# Patient Record
Sex: Female | Born: 1950 | Race: White | Hispanic: No | Marital: Married | Smoking: Never smoker
Health system: Southern US, Community
[De-identification: ages and names within clinical notes are randomized; demographics above are authoritative.]

## PROBLEM LIST (undated history)

## (undated) DIAGNOSIS — F32A Depression, unspecified: Secondary | ICD-10-CM

## (undated) DIAGNOSIS — F329 Major depressive disorder, single episode, unspecified: Secondary | ICD-10-CM

## (undated) DIAGNOSIS — E785 Hyperlipidemia, unspecified: Secondary | ICD-10-CM

## (undated) DIAGNOSIS — Z9889 Other specified postprocedural states: Secondary | ICD-10-CM

## (undated) DIAGNOSIS — K219 Gastro-esophageal reflux disease without esophagitis: Secondary | ICD-10-CM

## (undated) DIAGNOSIS — I639 Cerebral infarction, unspecified: Secondary | ICD-10-CM

## (undated) DIAGNOSIS — R112 Nausea with vomiting, unspecified: Secondary | ICD-10-CM

## (undated) DIAGNOSIS — N189 Chronic kidney disease, unspecified: Secondary | ICD-10-CM

## (undated) DIAGNOSIS — I10 Essential (primary) hypertension: Secondary | ICD-10-CM

## (undated) HISTORY — PX: OTHER SURGICAL HISTORY: SHX169

## (undated) HISTORY — PX: TENNIS ELBOW RELEASE/NIRSCHEL PROCEDURE: SHX6651

## (undated) HISTORY — PX: TONSILLECTOMY: SUR1361

## (undated) HISTORY — PX: COLONOSCOPY W/ POLYPECTOMY: SHX1380

## (undated) HISTORY — PX: BREAST REDUCTION SURGERY: SHX8

## (undated) HISTORY — PX: OOPHORECTOMY: SHX86

## (undated) HISTORY — DX: Cerebral infarction, unspecified: I63.9

---

## 1978-09-01 HISTORY — PX: TUBAL LIGATION: SHX77

## 1999-05-30 ENCOUNTER — Ambulatory Visit (HOSPITAL_COMMUNITY): Admission: RE | Admit: 1999-05-30 | Discharge: 1999-05-30 | Payer: Self-pay | Admitting: Neurosurgery

## 1999-05-30 ENCOUNTER — Encounter: Payer: Self-pay | Admitting: Neurosurgery

## 1999-09-12 ENCOUNTER — Other Ambulatory Visit: Admission: RE | Admit: 1999-09-12 | Discharge: 1999-09-12 | Payer: Self-pay | Admitting: Obstetrics and Gynecology

## 1999-10-25 ENCOUNTER — Other Ambulatory Visit: Admission: RE | Admit: 1999-10-25 | Discharge: 1999-10-25 | Payer: Self-pay | Admitting: Obstetrics and Gynecology

## 2000-04-14 ENCOUNTER — Ambulatory Visit (HOSPITAL_BASED_OUTPATIENT_CLINIC_OR_DEPARTMENT_OTHER): Admission: RE | Admit: 2000-04-14 | Discharge: 2000-04-14 | Payer: Self-pay | Admitting: Plastic Surgery

## 2000-04-21 ENCOUNTER — Emergency Department (HOSPITAL_COMMUNITY): Admission: EM | Admit: 2000-04-21 | Discharge: 2000-04-21 | Payer: Self-pay | Admitting: Emergency Medicine

## 2000-04-21 ENCOUNTER — Encounter: Payer: Self-pay | Admitting: Emergency Medicine

## 2000-08-17 ENCOUNTER — Ambulatory Visit (HOSPITAL_BASED_OUTPATIENT_CLINIC_OR_DEPARTMENT_OTHER): Admission: RE | Admit: 2000-08-17 | Discharge: 2000-08-17 | Payer: Self-pay | Admitting: Otolaryngology

## 2000-10-01 ENCOUNTER — Encounter (INDEPENDENT_AMBULATORY_CARE_PROVIDER_SITE_OTHER): Payer: Self-pay | Admitting: Specialist

## 2000-10-01 ENCOUNTER — Other Ambulatory Visit: Admission: RE | Admit: 2000-10-01 | Discharge: 2000-10-01 | Payer: Self-pay | Admitting: *Deleted

## 2000-11-24 ENCOUNTER — Other Ambulatory Visit: Admission: RE | Admit: 2000-11-24 | Discharge: 2000-11-24 | Payer: Self-pay | Admitting: Obstetrics and Gynecology

## 2001-10-25 ENCOUNTER — Ambulatory Visit (HOSPITAL_COMMUNITY): Admission: RE | Admit: 2001-10-25 | Discharge: 2001-10-25 | Payer: Self-pay | Admitting: *Deleted

## 2001-10-25 ENCOUNTER — Encounter (INDEPENDENT_AMBULATORY_CARE_PROVIDER_SITE_OTHER): Payer: Self-pay | Admitting: *Deleted

## 2001-12-29 ENCOUNTER — Other Ambulatory Visit: Admission: RE | Admit: 2001-12-29 | Discharge: 2001-12-29 | Payer: Self-pay | Admitting: Obstetrics and Gynecology

## 2002-01-17 ENCOUNTER — Encounter: Payer: Self-pay | Admitting: *Deleted

## 2002-01-17 ENCOUNTER — Encounter: Admission: RE | Admit: 2002-01-17 | Discharge: 2002-01-17 | Payer: Self-pay | Admitting: *Deleted

## 2002-06-17 ENCOUNTER — Encounter: Payer: Self-pay | Admitting: Gastroenterology

## 2002-06-17 ENCOUNTER — Encounter: Admission: RE | Admit: 2002-06-17 | Discharge: 2002-06-17 | Payer: Self-pay | Admitting: Gastroenterology

## 2002-07-07 ENCOUNTER — Ambulatory Visit (HOSPITAL_COMMUNITY): Admission: RE | Admit: 2002-07-07 | Discharge: 2002-07-07 | Payer: Self-pay | Admitting: Gastroenterology

## 2002-07-07 ENCOUNTER — Encounter: Payer: Self-pay | Admitting: Gastroenterology

## 2003-01-04 ENCOUNTER — Other Ambulatory Visit: Admission: RE | Admit: 2003-01-04 | Discharge: 2003-01-04 | Payer: Self-pay | Admitting: Obstetrics and Gynecology

## 2003-11-03 ENCOUNTER — Encounter: Admission: RE | Admit: 2003-11-03 | Discharge: 2003-11-03 | Payer: Self-pay | Admitting: Family Medicine

## 2003-12-20 ENCOUNTER — Encounter (INDEPENDENT_AMBULATORY_CARE_PROVIDER_SITE_OTHER): Payer: Self-pay | Admitting: Specialist

## 2003-12-21 ENCOUNTER — Inpatient Hospital Stay (HOSPITAL_COMMUNITY): Admission: RE | Admit: 2003-12-21 | Discharge: 2003-12-23 | Payer: Self-pay | Admitting: Obstetrics and Gynecology

## 2004-08-22 ENCOUNTER — Other Ambulatory Visit: Admission: RE | Admit: 2004-08-22 | Discharge: 2004-08-22 | Payer: Self-pay | Admitting: Obstetrics and Gynecology

## 2005-10-16 ENCOUNTER — Other Ambulatory Visit: Admission: RE | Admit: 2005-10-16 | Discharge: 2005-10-16 | Payer: Self-pay | Admitting: Obstetrics and Gynecology

## 2006-08-02 ENCOUNTER — Emergency Department (HOSPITAL_COMMUNITY): Admission: EM | Admit: 2006-08-02 | Discharge: 2006-08-02 | Payer: Self-pay | Admitting: Emergency Medicine

## 2010-03-28 ENCOUNTER — Emergency Department (HOSPITAL_COMMUNITY): Admission: EM | Admit: 2010-03-28 | Discharge: 2010-03-29 | Payer: Self-pay | Admitting: Emergency Medicine

## 2010-12-25 ENCOUNTER — Other Ambulatory Visit: Payer: Self-pay | Admitting: Obstetrics and Gynecology

## 2011-01-17 NOTE — Discharge Summary (Signed)
NAME:  Kendra Deleon, Kendra Deleon                          ACCOUNT NO.:  [id number]   MEDICAL RECORD NO.:  [id number]                   PATIENT TYPE:  INP   LOCATION:  9306                                 FACILITY:  [facility]   PHYSICIAN:  Miguel Aschoff, M.D.                    DATE OF BIRTH:  Jan 27, 1951   DATE OF ADMISSION:  12/20/2003  DATE OF DISCHARGE:  12/23/2003                                 DISCHARGE SUMMARY   ADMISSION DIAGNOSES:  Persistent right adnexal mass in a postmenopausal  woman.   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   OPERATION:  Laparoscopic right salpingo-oophorectomy.   BRIEF HISTORY:  The patient is a 60 year old white female who presented with  a history of a right lower quadrant right flank pain.  The patient underwent  evaluation, which included CT scan revealing a mass in the area of the right  ovary.  This had essentially simple appearance and was followed to see if it  would resolve spontaneously.  A CA125 level was obtained and was within  normal limits.  However, after following this for several weeks, the mass  persisted as did the pain.  The patient presented to the hospital at this  point to undergo laparoscopy to see the etiology for this mass and be  corrected, and to rule out the presence of any kind of neoplasm.   HOSPITAL COURSE:  Preoperative studies were obtained.  This included  admission hemoglobin of 12.6, hematocrit of 38.5.  Chemistry profile was  within normal limits, except for slight elevation of the glucose at 137.  Urinalysis was negative.   On December 20, 2003 under general anesthesia, laparoscopy was carried out.  This revealed the presence of a normal sized uterus, left tube and ovary  were within normal limits with the ovary being in suspension with menopause,  being small in size.  On the right adnexa, however, there was a 4-5 cm  cystic mass present.  The appendix was visualized and was noted to be within  normal limits.   Intestinal structures were unremarkable, except for what  appeared to be some adhesions in the area of the ileum.  There was also some  mild inflammation noted on the descending colon just adjacent to the cecum.   At time of laparoscopy, a right salpingo-oophorectomy was carried out  without difficulty.  The patient tolerated the surgical procedure well.  The  estimated blood loss at the time of surgery was approximately 50 cc.   The patient was admitted and by the second postoperative day was tolerating  a regular diet.  Pain was managed with p.o. pain medications.  She was  discharged to home on December 23, 2003.   The pathology report of the ovary revealed the presence of a benign ovarian  cyst adenoma.  No other abnormalities were noted.   PLAN:  The  patient will be discharged home.  She is to return to the office  in four weeks for a followup examination.  She was sent home on a regular  diet in satisfactory condition.  She was instructed to have no heavy  lifting.  She is to return to work as soon as she was comfortable.   DISCHARGE MEDICATIONS:  Vicodin one q.3h. p.r.n. pain.                                               Miguel Aschoff, M.D.    AR/MEDQ  D:  12/31/2003  T:  12/31/2003  Job:  [id number]

## 2011-01-17 NOTE — Op Note (Signed)
NAME:  Kendra Deleon, Kendra Deleon                          ACCOUNT NO.:  [id number]   MEDICAL RECORD NO.:  [id number]                   PATIENT TYPE:  INP   LOCATION:  9306                                 FACILITY:  [facility]   PHYSICIAN:  Miguel Aschoff, M.D.                    DATE OF BIRTH:  06/09/1951   DATE OF PROCEDURE:  12/20/2003  DATE OF DISCHARGE:                                 OPERATIVE REPORT   PREOPERATIVE DIAGNOSES:  1. Cystic right adnexal mass in postmenopausal woman.  2. Pelvic pain.   POSTOPERATIVE DIAGNOSES:  1. Cystic right adnexal mass in postmenopausal woman.  2. Pelvic pain.   OPERATIONS AND PROCEDURES:  Laparoscopy with laparoscopic right salpingo-  oophorectomy.   SURGEON:  Dr. Miguel Aschoff.   ASSISTANT:  Dr. Conley Simmonds.   ANESTHESIA:  General.   COMPLICATIONS:  None.   JUSTIFICATION:  The patient is a 60 year old white female who presented with  a complaint of right lower quadrant pain and back pain.  On CT examination  she was noted to have a 4-5 cm cystic mass in the right adnexa.  This mass  was observed for several weeks without resolution and now the patient is  being taken to the operating room to undergo laparoscopy to see the etiology  of this mass and to removed it.  Risks and benefits of the procedure were  discussed with the patient.   DESCRIPTION OF PROCEDURE:  The patient was taken to the operating room,  placed in the supine position.  General anesthesia was administered without  difficulty.  She was then placed in the dorsal lithotomy position, prepped  and draped in the usual sterile fashion.  The bladder was catheterized and a  Hulka tenaculum was placed through the cervix.  At this point a small  infraumbilical incision was made, a Veress needle was inserted, and the  abdomen was insufflated with 3 L of CO2.  Following the insufflation, the  trocared laparoscope was placed followed by laparoscope itself.  Then, to  allow better visualization, a 5 mm  suprapubic port was established under  direct visualization.  Systematic inspection revealed the anterior bladder  and peritoneum to be unremarkable.  The uterus was noted to be normal in  size and shape.  Both tubes were inspected and were noted to have previous  signs of prior tubal sterilization.  The left ovary was inspected and was  found to be small.  The right ovary was enlarged, approximately 5 cm in  size.  It had smooth external surface; no excrescences were noted.  No  lesions were noted in the cul-de-sac.  The appendix was visualized and  appeared to be within normal limits.  The cecum was visualized.  It appeared  to be somewhat erythematous.  The small intestines were visualized.  They  appeared to be within normal limits.  However, there was a  band of adhesions  holding part of the ileum down to the right pelvic side wall.  The liver  surface was inspected and it was noted to be within normal limits.  At this  point a third port was established 12 mm in size in the right lower  quadrant.  The tripolar forceps were introduced.  The infundibulopelvic  ligament was then identified as was the ureter and then the  infundibulopelvic ligament was cauterized and cut with the tripolar forceps,  and then the dissection continued along the mesovarian ligament towards the  uteroovarian ligament in a serial fashion using the tripolar cautery.  It  was then possible to excise the right tube and ovary in toto, freeing it  from the right cornual portion of the uterus.  At this point an EndoCatch  bag was placed into the abdomen, the specimen was placed into the bag, and  then it was brought to the abdominal surface where it was possible to  decompress the ovary and remove it via the 12 mm port site.  At this point  with no other abnormalities being noted, the instruments were removed, the  CO2 was allowed to escape, there was excellent hemostasis present.  The 12  mm port site fascial  incision was closed using figure-of-eight suture of 0  Vicryl and then skin incisions were closed using subcuticular 40 Vicryl.  The estimated blood loss from the procedure was less than 30 mL.  The plan  is for the patient to be observed overnight and if in stable condition will  be discharged home on December 21, 2003.  Final pathology waits on the ovarian  specimen.  Medications for home include Tylox one q.3h. as needed for pain.  The patient instructed to do no heavy lifting, place nothing in the vagina,  and will call for a pathology report in 1 week.                                               Miguel Aschoff, M.D.    AR/MEDQ  D:  12/21/2003  T:  12/22/2003  Job:  [id number]

## 2013-02-09 ENCOUNTER — Other Ambulatory Visit: Payer: Self-pay | Admitting: Obstetrics and Gynecology

## 2013-05-17 ENCOUNTER — Other Ambulatory Visit: Payer: Self-pay | Admitting: Gastroenterology

## 2013-05-17 DIAGNOSIS — R109 Unspecified abdominal pain: Secondary | ICD-10-CM

## 2013-12-09 ENCOUNTER — Other Ambulatory Visit: Payer: Self-pay | Admitting: Otolaryngology

## 2013-12-09 DIAGNOSIS — K1120 Sialoadenitis, unspecified: Secondary | ICD-10-CM

## 2013-12-16 ENCOUNTER — Other Ambulatory Visit: Payer: Self-pay

## 2013-12-21 ENCOUNTER — Ambulatory Visit
Admission: RE | Admit: 2013-12-21 | Discharge: 2013-12-21 | Disposition: A | Discharge status: Home / Self Care | Payer: No Typology Code available for payment source | Source: Ambulatory Visit | Attending: Otolaryngology | Admitting: Otolaryngology

## 2013-12-21 DIAGNOSIS — K1120 Sialoadenitis, unspecified: Secondary | ICD-10-CM

## 2013-12-21 MED ORDER — GADOBENATE DIMEGLUMINE 529 MG/ML IV SOLN
14.0000 mL | Freq: Once | INTRAVENOUS | Status: AC | PRN
  Administered 2013-12-21: 14 mL via INTRAVENOUS

## 2014-03-16 ENCOUNTER — Other Ambulatory Visit: Payer: Self-pay | Admitting: Obstetrics and Gynecology

## 2014-03-17 LAB — CYTOLOGY - PAP

## 2015-04-04 DIAGNOSIS — Z1231 Encounter for screening mammogram for malignant neoplasm of breast: Secondary | ICD-10-CM | POA: Diagnosis not present

## 2015-04-10 DIAGNOSIS — Z1289 Encounter for screening for malignant neoplasm of other sites: Secondary | ICD-10-CM | POA: Diagnosis not present

## 2015-05-22 DIAGNOSIS — Z23 Encounter for immunization: Secondary | ICD-10-CM | POA: Diagnosis not present

## 2015-05-23 ENCOUNTER — Encounter: Payer: Self-pay | Admitting: Podiatry

## 2015-05-23 ENCOUNTER — Ambulatory Visit (INDEPENDENT_AMBULATORY_CARE_PROVIDER_SITE_OTHER): Payer: Medicare Other | Admitting: Podiatry

## 2015-05-23 VITALS — Resp 16

## 2015-05-23 DIAGNOSIS — M2012 Hallux valgus (acquired), left foot: Secondary | ICD-10-CM | POA: Diagnosis not present

## 2015-05-23 DIAGNOSIS — B351 Tinea unguium: Secondary | ICD-10-CM

## 2015-05-23 DIAGNOSIS — M21612 Bunion of left foot: Secondary | ICD-10-CM

## 2015-05-23 NOTE — Progress Notes (Signed)
   Subjective:    Patient ID: Kendra Deleon, female    DOB: 1951/04/13, 64 y.o.   MRN: [id number]  HPI Patient presents with a nail problem on their left foot, great toe. Nail fungus. Pt has not used OTC medications.   Review of Systems  Skin: Positive for color change.  All other systems reviewed and are negative.      Objective:   Physical Exam        Assessment & Plan:

## 2015-05-23 NOTE — Progress Notes (Signed)
Subjective:     Patient ID: Kendra Deleon, female   DOB: [birth year], 64 y.o.   MRN: [id number]  HPI patient states have not been seen for a number of years and I have a thickened hallux nail left that just occurred in the last couple months after pedicures. Also have a bunion deformity left over right I did of the right one fixed in number of years ago   Review of Systems  All other systems reviewed and are negative.      Objective:   Physical Exam  Constitutional: She is oriented to person, place, and time.  Cardiovascular: Intact distal pulses.   Musculoskeletal: Normal range of motion.  Neurological: She is oriented to person, place, and time.  Skin: Skin is warm.  Nursing note and vitals reviewed.  neurovascular status intact muscle strength adequate range of motion within normal limits with patient noted to have good digital perfusion and well oriented 3. 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 May require ultimate oral treatment or possible laser depending on findings. Do not recommend procedure for bunion as it is not currently symptomatic

## 2015-05-24 ENCOUNTER — Telehealth: Payer: Self-pay | Admitting: *Deleted

## 2015-05-24 DIAGNOSIS — B351 Tinea unguium: Secondary | ICD-10-CM

## 2015-05-24 NOTE — Telephone Encounter (Signed)
Toenail fragment sent to [facility] for definitive diagnosis of fungal elements.

## 2015-05-28 ENCOUNTER — Observation Stay (HOSPITAL_COMMUNITY): Payer: Medicare Other

## 2015-05-28 ENCOUNTER — Encounter (HOSPITAL_COMMUNITY): Payer: Self-pay | Admitting: Emergency Medicine

## 2015-05-28 ENCOUNTER — Observation Stay (HOSPITAL_COMMUNITY)
Admission: EM | Admit: 2015-05-28 | Discharge: 2015-05-30 | Disposition: A | Discharge status: Home / Self Care | Payer: Medicare Other | Attending: Internal Medicine | Admitting: Internal Medicine

## 2015-05-28 ENCOUNTER — Emergency Department (HOSPITAL_COMMUNITY): Payer: Medicare Other

## 2015-05-28 DIAGNOSIS — R9439 Abnormal result of other cardiovascular function study: Secondary | ICD-10-CM | POA: Insufficient documentation

## 2015-05-28 DIAGNOSIS — R51 Headache: Secondary | ICD-10-CM | POA: Insufficient documentation

## 2015-05-28 DIAGNOSIS — R001 Bradycardia, unspecified: Secondary | ICD-10-CM | POA: Diagnosis not present

## 2015-05-28 DIAGNOSIS — D649 Anemia, unspecified: Secondary | ICD-10-CM | POA: Diagnosis present

## 2015-05-28 DIAGNOSIS — F329 Major depressive disorder, single episode, unspecified: Secondary | ICD-10-CM | POA: Insufficient documentation

## 2015-05-28 DIAGNOSIS — K219 Gastro-esophageal reflux disease without esophagitis: Secondary | ICD-10-CM | POA: Diagnosis not present

## 2015-05-28 DIAGNOSIS — F32A Depression, unspecified: Secondary | ICD-10-CM | POA: Diagnosis present

## 2015-05-28 DIAGNOSIS — R0789 Other chest pain: Principal | ICD-10-CM | POA: Insufficient documentation

## 2015-05-28 DIAGNOSIS — R202 Paresthesia of skin: Secondary | ICD-10-CM | POA: Diagnosis present

## 2015-05-28 DIAGNOSIS — E785 Hyperlipidemia, unspecified: Secondary | ICD-10-CM | POA: Diagnosis present

## 2015-05-28 DIAGNOSIS — Z79899 Other long term (current) drug therapy: Secondary | ICD-10-CM | POA: Insufficient documentation

## 2015-05-28 DIAGNOSIS — R519 Headache, unspecified: Secondary | ICD-10-CM | POA: Insufficient documentation

## 2015-05-28 DIAGNOSIS — R079 Chest pain, unspecified: Secondary | ICD-10-CM | POA: Diagnosis present

## 2015-05-28 DIAGNOSIS — I639 Cerebral infarction, unspecified: Secondary | ICD-10-CM

## 2015-05-28 DIAGNOSIS — I10 Essential (primary) hypertension: Secondary | ICD-10-CM | POA: Diagnosis not present

## 2015-05-28 DIAGNOSIS — R072 Precordial pain: Secondary | ICD-10-CM | POA: Diagnosis not present

## 2015-05-28 DIAGNOSIS — Z7982 Long term (current) use of aspirin: Secondary | ICD-10-CM | POA: Diagnosis not present

## 2015-05-28 HISTORY — DX: Essential (primary) hypertension: I10

## 2015-05-28 HISTORY — DX: Major depressive disorder, single episode, unspecified: F32.9

## 2015-05-28 HISTORY — DX: Depression, unspecified: F32.A

## 2015-05-28 HISTORY — DX: Hyperlipidemia, unspecified: E78.5

## 2015-05-28 LAB — CBC
HCT: 33.3 % — ABNORMAL LOW (ref 36.0–46.0)
Hemoglobin: 11 g/dL — ABNORMAL LOW (ref 12.0–15.0)
MCH: 26.9 pg (ref 26.0–34.0)
MCHC: 33 g/dL (ref 30.0–36.0)
MCV: 81.4 fL (ref 78.0–100.0)
PLATELETS: 259 10*3/uL (ref 150–400)
RBC: 4.09 MIL/uL (ref 3.87–5.11)
RDW: 14 % (ref 11.5–15.5)
WBC: 5 10*3/uL (ref 4.0–10.5)

## 2015-05-28 LAB — BASIC METABOLIC PANEL
Anion gap: 8 (ref 5–15)
BUN: 19 mg/dL (ref 6–20)
CALCIUM: 9.2 mg/dL (ref 8.9–10.3)
CHLORIDE: 107 mmol/L (ref 101–111)
CO2: 27 mmol/L (ref 22–32)
CREATININE: 1 mg/dL (ref 0.44–1.00)
GFR calc non Af Amer: 58 mL/min — ABNORMAL LOW (ref >60)
Glucose, Bld: 99 mg/dL (ref 65–99)
Potassium: 3.9 mmol/L (ref 3.5–5.1)
SODIUM: 142 mmol/L (ref 135–145)

## 2015-05-28 LAB — TROPONIN I

## 2015-05-28 LAB — I-STAT TROPONIN, ED: TROPONIN I, POC: 0 ng/mL (ref 0.00–0.08)

## 2015-05-28 MED ORDER — PANTOPRAZOLE SODIUM 40 MG PO TBEC
40.0000 mg | DELAYED_RELEASE_TABLET | Freq: Every day | ORAL | Status: DC
  Administered 2015-05-28 – 2015-05-30 (×3): 40 mg via ORAL
  Filled 2015-05-28 (×4): qty 1

## 2015-05-28 MED ORDER — ENOXAPARIN SODIUM 40 MG/0.4ML ~~LOC~~ SOLN
40.0000 mg | SUBCUTANEOUS | Status: DC
  Filled 2015-05-28: qty 0.4

## 2015-05-28 MED ORDER — SODIUM CHLORIDE 0.9 % IV SOLN
INTRAVENOUS | Status: AC
  Administered 2015-05-28: 11:00:00 via INTRAVENOUS

## 2015-05-28 MED ORDER — LISINOPRIL 20 MG PO TABS
20.0000 mg | ORAL_TABLET | Freq: Every day | ORAL | Status: DC
  Administered 2015-05-28: 20 mg via ORAL
  Filled 2015-05-28: qty 2

## 2015-05-28 MED ORDER — STROKE: EARLY STAGES OF RECOVERY BOOK
Freq: Once | Status: DC
  Filled 2015-05-28: qty 1

## 2015-05-28 MED ORDER — ACETAMINOPHEN 325 MG PO TABS
650.0000 mg | ORAL_TABLET | ORAL | Status: DC | PRN
  Administered 2015-05-28 – 2015-05-29 (×4): 650 mg via ORAL
  Filled 2015-05-28 (×5): qty 2

## 2015-05-28 MED ORDER — FLUOXETINE HCL 20 MG PO CAPS
20.0000 mg | ORAL_CAPSULE | Freq: Every day | ORAL | Status: DC
  Administered 2015-05-28: 20 mg via ORAL
  Filled 2015-05-28: qty 1

## 2015-05-28 MED ORDER — HYDROCHLOROTHIAZIDE 12.5 MG PO CAPS
12.5000 mg | ORAL_CAPSULE | Freq: Every day | ORAL | Status: DC
  Administered 2015-05-28: 12.5 mg via ORAL
  Filled 2015-05-28: qty 1

## 2015-05-28 MED ORDER — GI COCKTAIL ~~LOC~~
30.0000 mL | Freq: Four times a day (QID) | ORAL | Status: DC | PRN
  Filled 2015-05-28: qty 30

## 2015-05-28 MED ORDER — SIMVASTATIN 40 MG PO TABS
40.0000 mg | ORAL_TABLET | Freq: Every day | ORAL | Status: DC
  Administered 2015-05-28 – 2015-05-29 (×2): 40 mg via ORAL
  Filled 2015-05-28 (×2): qty 1

## 2015-05-28 MED ORDER — FENOFIBRATE 160 MG PO TABS
160.0000 mg | ORAL_TABLET | Freq: Every day | ORAL | Status: DC
  Administered 2015-05-28: 160 mg via ORAL
  Filled 2015-05-28: qty 1

## 2015-05-28 MED ORDER — MORPHINE SULFATE (PF) 2 MG/ML IV SOLN: 2.0000 mg | INTRAVENOUS | Status: DC | PRN

## 2015-05-28 MED ORDER — ONDANSETRON HCL 4 MG/2ML IJ SOLN
4.0000 mg | Freq: Once | INTRAMUSCULAR | Status: AC
  Administered 2015-05-28: 4 mg via INTRAVENOUS
  Filled 2015-05-28: qty 2

## 2015-05-28 MED ORDER — ASPIRIN 325 MG PO TABS: 325.0000 mg | ORAL_TABLET | Freq: Every day | ORAL | Status: DC

## 2015-05-28 MED ORDER — ONDANSETRON HCL 4 MG/2ML IJ SOLN: 4.0000 mg | Freq: Four times a day (QID) | INTRAMUSCULAR | Status: DC | PRN

## 2015-05-28 MED ORDER — ASPIRIN 81 MG PO CHEW
324.0000 mg | CHEWABLE_TABLET | Freq: Once | ORAL | Status: DC
  Filled 2015-05-28: qty 4

## 2015-05-28 MED ORDER — MORPHINE SULFATE (PF) 4 MG/ML IV SOLN
4.0000 mg | Freq: Once | INTRAVENOUS | Status: AC
  Administered 2015-05-28: 4 mg via INTRAVENOUS
  Filled 2015-05-28: qty 1

## 2015-05-28 MED ORDER — LISINOPRIL-HYDROCHLOROTHIAZIDE 20-12.5 MG PO TABS: 1.0000 | ORAL_TABLET | Freq: Every day | ORAL | Status: DC

## 2015-05-28 NOTE — ED Provider Notes (Signed)
CSN: [id number]     Arrival date & time 05/28/15  0910 History   First MD Initiated Contact with Patient 05/28/15 0913     Chief Complaint  Patient presents with  . Chest Pain     (Consider location/radiation/quality/duration/timing/severity/associated sxs/prior Treatment) HPI  Kendra Deleon Is a 64 year old female with a past medical history of hypertension, hyperlipidemia and reflux who presents emergency Department with chief complaint of chest pain. The patient states that yesterday she was feeling very poorly and rundown. She noticed that she was having right arm pain and some paresthesias in the hand and up the arm. The pain radiates to her right shoulder blade. She states that she had significant discomfort getting to sleep. The pain also radiated up into her jaw. Patient states she was able to get to sleep with decreased pain. This morning she awoke and the pain returned. She then began having chest pressure across her precordium. She denies shortness of breath, nausea, diaphoresis. She denies previous history of carpal tunnel syndrome, recent injury to her neck or back. She has a past family history of both grand fathers having MI. Her father died of an MI in his 35s, but had type 1 diabetes. She denies a history of smoking. Patient got 2 doses of sublingual nitroglycerin with some relief of her discomfort. She took 324 of aspirin prior to arrival.  Past Medical History  Diagnosis Date  . Hypertension    History reviewed. No pertinent past surgical history. History reviewed. No pertinent family history. Social History  Substance Use Topics  . Smoking status: Never Smoker   . Smokeless tobacco: None  . Alcohol Use: No   OB History    No data available     Review of Systems  Ten systems reviewed and are negative for acute change, except as noted in the HPI. \  Allergies  Review of patient's allergies indicates no known allergies.  Home Medications   Prior to Admission  medications   Medication Sig Start Date End Date Taking? Authorizing Provider  Calcium Carbonate-Vitamin D (CALTRATE 600+D PO) Take 1 tablet by mouth daily.    Historical Provider, MD  Cholecalciferol (VITAMIN D3) 2000 UNITS TABS Take 1 tablet by mouth daily.    Historical Provider, MD  fenofibrate 160 MG tablet Take 160 mg by mouth daily.  05/17/15   Historical Provider, MD  FLUoxetine (PROZAC) 20 MG capsule Take 20 mg by mouth daily.  04/24/15   Historical Provider, MD  lisinopril-hydrochlorothiazide (PRINZIDE,ZESTORETIC) 20-12.5 MG per tablet Take 1 tablet by mouth daily.  05/09/15   Historical Provider, MD  pantoprazole (PROTONIX) 40 MG tablet Take 40 mg by mouth daily.  04/28/15   Historical Provider, MD  simvastatin (ZOCOR) 40 MG tablet Take 40 mg by mouth daily.  04/24/15   Historical Provider, MD   BP 128/59 mmHg  Pulse 51  Temp(Src) 98.1 F (36.7 C) (Oral)  Resp 16  SpO2 100% Physical Exam  Constitutional: She is oriented to person, place, and time. She appears well-developed and well-nourished. No distress.  HENT:  Head: Normocephalic and atraumatic.  Eyes: Conjunctivae are normal. No scleral icterus.  Neck: Normal range of motion.  Cardiovascular: Normal rate, regular rhythm and normal heart sounds.  Exam reveals no gallop and no friction rub.   No murmur heard. Pulmonary/Chest: Effort normal and breath sounds normal. No respiratory distress.  Abdominal: Soft. Bowel sounds are normal. She exhibits no distension and no mass. There is no tenderness. There is no  guarding.  Musculoskeletal:  No tender to palpation of the shoulder, scapula, or trapezius. No chest tenderness. Negative Tinel and Phalen sign  Neurological: She is alert and oriented to person, place, and time.  Skin: Skin is warm and dry. She is not diaphoretic.  Nursing note and vitals reviewed.   ED Course  Procedures (including critical care time) Labs Review Labs Reviewed  BASIC METABOLIC PANEL  [place], ED    Imaging Review No results found. I have personally reviewed and evaluated these images and lab results as part of my medical decision-making.   EKG Interpretation   Date/Time:  Monday May 28 2015 09:15:48 EDT Ventricular Rate:  53 PR Interval:  134 QRS Duration: 77 QT Interval:  442 QTC Calculation: 415 R Axis:   35 Text Interpretation:  Sinus or ectopic atrial rhythm Nonspecific T wave  abnormality No previous tracing Confirmed by POLLINA  MD, CHRISTOPHER  ([id number]) on 05/28/2015 9:20:41 AM      MDM   Final diagnoses:  Chest pain, unspecified chest pain type    .9:37 AM BP 128/59 mmHg  Pulse 51  Temp(Src) 98.1 F (36.7 C) (Oral)  Resp 16  SpO2 100% Patient here with complaint of chest pain, precordial pressure, right arm pain that radiates into her jaw. With increasing discomfort and somewhat relieved by sublingual nitroglycerin. Her EKG shows sinus bradycardia with rates in the 50s, but appears otherwise normal. She is not on a beta blocker and she denies having a history of low heart rate. Labs are pending. CP is not reproducible on exam    10:34 AM BP 132/66 mmHg  Pulse 51  Temp(Src) 98.1 F (36.7 C) (Oral)  Resp 17  SpO2 97% Patient with HEART score of 6 making her moderate risk for MACE.   11:24 AM  BP 121/62 mmHg  Pulse 52  Temp(Src) 98.1 F (36.7 C) (Oral)  Resp 15  SpO2 99%  Patient admitted for CP r/o No current chest discomfort. Appears safe for admission. Patient seen in shared visit with attending physician.   Margarita Mail, PA-C 05/28/15 [place], MD 05/28/15 1136

## 2015-05-28 NOTE — Progress Notes (Signed)
Report given to [place], RN for transfer to [facility]

## 2015-05-28 NOTE — Progress Notes (Signed)
Report received from [place], [place] for admission to [facility]W

## 2015-05-28 NOTE — H&P (Signed)
[facility] History and Physical                                                                                    Kendra Deleon, is a 64 y.o. female  MRN: [id number]   DOB - 21-May-1951  Admit Date - 05/28/2015  Outpatient Primary MD for the patient is Simona Huh, MD  Referring Physician:  Dr. Betsey Holiday (EDP)  Chief Complaint:   Chief Complaint  Patient presents with  . Chest Pain     HPI  Kendra Deleon  is a 64 y.o. female who presented to ED this am with new onset of chest pain. Last night around 7pm patient developed a headache, she had tingling down RUE.   She had some mild mid chest discomfort but eventually fell asleep. Patient slept through the night but awoke with persistent symptoms and progressive chest pain around 7:30am. Pain radiates through to her back at level of shoulder blades. Pain is intermittent, not associated with deep inspiration or cough. No associated SOB, nausea or diaphoresis. Though present when still, movement can exacerbate the pain.  Patient has no history of CAD / CVA / blood clots. Husband denies any slurred speech or confusion.  EMS gave patient Nitro which significantly helped the pain. She is having recurrent mid chest pain again. Patient has a history of GERD. Pain not reminiscent of GERD however. She is on chronic PPI therapy  In ED CXR negative.  POC troponin WNL. No acute findings on EKG. Labs basically unremarkable.   Review of Systems   In addition to the HPI above,  No Fever-chills, No changes with Vision or hearing, No problems swallowing food or Liquids, No Shortness of Breath, No Abdominal pain, No Nausea or Vomiting, Bowel movements are regular, No Blood in stool or Urine, No dysuria, No new skin rashes or bruises, No new joints pains-aches,  No new weakness in any extremity, No recent weight gain or loss, A full 10 point Review of Systems was done, except as stated above, all other Review of Systems were negative.  Past  Medical History  Past Medical History  Diagnosis Date  . Hypertension   . Hyperlipidemia   . Depression     History reviewed. No pertinent past surgical history.   Social History Social History  Substance Use Topics  . Smoking status: Never Smoker   . Smokeless tobacco: Not on file  . Alcohol Use: No   Resides at: home Lives with: husband Ambulatory status: no assistive devices  Family History Family History  Problem Relation Age of Onset  . Coronary artery disease Father     died at 56 from MI    Prior to Admission medications   Medication Sig Start Date End Date Taking? Authorizing Provider  aspirin 325 MG tablet Take 325 mg by mouth once.   Yes Historical Provider, MD  Calcium Carbonate-Vitamin D (CALTRATE 600+D PO) Take 1 tablet by mouth daily.   Yes Historical Provider, MD  Cholecalciferol (VITAMIN D3) 2000 UNITS TABS Take 1 tablet by mouth daily.   Yes Historical Provider, MD  fenofibrate 160 MG tablet Take 160 mg by mouth daily.  05/17/15  Yes Historical Provider, MD  FLUoxetine (PROZAC) 20 MG capsule Take 20 mg by mouth daily.  04/24/15  Yes Historical Provider, MD  lisinopril-hydrochlorothiazide (PRINZIDE,ZESTORETIC) 20-12.5 MG per tablet Take 1 tablet by mouth daily.  05/09/15  Yes Historical Provider, MD  pantoprazole (PROTONIX) 40 MG tablet Take 40 mg by mouth daily.  04/28/15  Yes Historical Provider, MD  simvastatin (ZOCOR) 40 MG tablet Take 40 mg by mouth daily.  04/24/15  Yes Historical Provider, MD    No Known Allergies  Physical Exam  Vitals  Blood pressure 121/62, pulse 52, temperature 98.1 F (36.7 C), temperature source Oral, resp. rate 15, SpO2 99 %.   General:  Pleasant white female lying in bed in NAD,   Psych:  Normal affect and insight, Not Suicidal or Homicidal, Awake Alert, Oriented X 3.  Neuro:   No F.N deficits, ALL C.Nerves Intact, Strength 5/5 all 4 extremities, Sensation intact all 4 extremities.  ENT:  Ears and Eyes appear Normal,  Conjunctivae clear, PER. Moist oral mucosa without erythema or exudates.  Neck:  Supple, No lymphadenopathy appreciated  Respiratory:  Symmetrical chest wall movement, Good air movement bilaterally, CTAB.  Cardiac:  RRR, No Murmurs, no LE edema noted, no JVD.  No chest wall tenderness  Abdomen:  Positive bowel sounds, Soft, Non tender, Non distended,  No masses appreciated  Skin:  No Cyanosis, Normal Skin Turgor, No Skin Rash or Bruise.  Extremities:  Able to move all 4. 5/5 strength in each,  no effusions.  Data Review  CBC  Recent Labs Lab 05/28/15 0941  WBC 5.0  HGB 11.0*  HCT 33.3*  PLT 259  MCV 81.4  MCH 26.9  MCHC 33.0  RDW 14.0    Chemistries   Recent Labs Lab 05/28/15 0941  NA 142  K 3.9  CL 107  CO2 27  GLUCOSE 99  BUN 19  CREATININE 1.00  CALCIUM 9.2    Cardiac Enzymes Troponin 0/0   Imaging results:   Dg Chest Port 1 View  05/28/2015   CLINICAL DATA:  Chest pain, right arm and neck pain  EXAM: PORTABLE CHEST 1 VIEW  COMPARISON:  02/22/2008  FINDINGS: The heart size and mediastinal contours are within normal limits. Both lungs are clear. The visualized skeletal structures are unremarkable.  IMPRESSION: No active disease.   Electronically Signed   By: Kathreen Devoid   On: 05/28/2015 09:41    My personal review of ACZ:YSAYT or ectopic atrial rhythm   Assessment & Plan  Principal Problem:   Chest pain Active Problems:   Depression   Dyslipidemia   Benign essential HTN   Normocytic anemia   GERD (gastroesophageal reflux disease)   Paresthesia  Chest pain.  Chest pain accompanied by RUE tingling. Etiology of RUE tingling unclear. No neurological deficits on exam. As far as chest pain. Patient has no previous history of CAD / or blood clots. Troponin is normal. No acute ECG findings. Patient's Heart score is 5 and she has ongoing chest pain. Will admit to stepdown, cycle troponins, repeat EKG at 4pm. Continue PPI, add GI cocktail. Cardiology  consulted  HTN. Stable. Continue home BP meds  Hyperlipidemia. Continue Statin , Fenofibrate  GERD. Stable on daily PPI. Followed by Dr. Earlean Shawl. Continue PPI.  Depression. Stable. Continue anti-depressant  Normocytic anemia (mild). Borderline low MCV. No overt GI bleeding. Up to date on colonoscopy per patient.    Consultants Called:  Cardiology  Family Communication: Patient alert, oriented and understands plan of  care.    Code Status:  Full code  Condition:  guarded  Potential Disposition: Discharge to home in 24-48 hours or when Cardiology deems appropriate.   Time spent in minutes : [place] on 05/28/2015 at 11:19 AM Between 7am to 4pm - Pager - [phone] After 4pm go to [email or web address] - password [email or web address] And look for the night coverage person covering me after hours

## 2015-05-28 NOTE — ED Provider Notes (Signed)
Patient presented to the ER with chest pain. Symptoms began last night. She does not have any cardiac history.  Face to face Exam: HEENT - PERRLA Lungs - CTAB Heart - RRR, no M/R/G Abd - S/NT/ND Neuro - alert, oriented x3  Plan: Cardiac evaluation/rule out.  Orpah Greek, MD 05/28/15 [phone]

## 2015-05-28 NOTE — Progress Notes (Signed)
PATIENT TRANSFERRED FROM 2W31 TO 5C13 VIA BED.

## 2015-05-28 NOTE — Consult Note (Addendum)
Consult Reason for Consult: RUE paresthesias Referring Physician: Dr Charlies Silvers  CC: RUE paresthesias  HPI: Kendra Deleon is an 64 y.o. female hx of HTN, HLD admitted with chest pain. Upon admission also noted RUE paresthesias. Reports these symptoms started yesterday. Initially noted a pins and needles sensation in her right fingers, then spread to involve the whole right arm. Also involves right side of neck. Denies any weakness though notes arm feels heavy. No speech or visual deficits. No LUE or LE symptoms. Denies any cervical trauma, no heavy lifting or neck manipulation prior to symptom onset.   MRI brain imaging reviewed, shows no acute process.   She reports seeing Dr Kendra Deleon in the past for a cervical neck problem (she is unsure what the problem was). 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   Past Medical History  Diagnosis Date  . Hypertension   . Hyperlipidemia   . Depression     Past Surgical History  Procedure Laterality Date  . Oophorectomy    . Breast reduction surgery      Family History  Problem Relation Age of Onset  . Coronary artery disease Father     died at 56 from MI  . Diabetes Mellitus I Father     father had juvenile diabetes  . Hypertension Mother   . Hyperlipidemia Mother   . Breast cancer Sister   . Heart attack Maternal Grandfather   . Heart attack Paternal Grandfather     Social History:  reports that she has never smoked. She does not have any smokeless tobacco history on file. She reports that she does not drink alcohol or use illicit drugs.  No Known Allergies  Medications:  Scheduled: .  stroke: mapping our early stages of recovery book   Does not apply Once  . sodium chloride   Intravenous STAT  . aspirin  324 mg Oral Once  . [START ON 05/29/2015] aspirin  325 mg Oral Daily  . enoxaparin (LOVENOX) injection  40 mg  Subcutaneous Q24H  . fenofibrate  160 mg Oral Daily  . FLUoxetine  20 mg Oral Daily  . hydrochlorothiazide  12.5 mg Oral Daily  . lisinopril  20 mg Oral Daily  . pantoprazole  40 mg Oral Q0600  . simvastatin  40 mg Oral QHS    ROS: Out of a complete 14 system review, the patient complains of only the following symptoms, and all other reviewed systems are negative. +paresthesias  Physical Examination: Filed Vitals:   05/28/15 1831  BP: 122/61  Pulse:   Temp: 98 F (36.7 C)  Resp: 16   Physical Exam  Constitutional: He appears well-developed and well-nourished.  Psych: Affect appropriate to situation Eyes: No scleral injection HENT: No OP obstrucion Head: Normocephalic.  Cardiovascular: Normal rate and regular rhythm.  Respiratory: Effort normal and breath sounds normal.  GI: Soft. Bowel sounds are normal. No distension. There is no tenderness.  Skin: WDI  Neurologic Examination Mental Status: Alert, oriented, thought content appropriate.  Speech fluent without evidence of aphasia.  Able to follow 3 step commands without difficulty. Cranial Nerves: II: funduscopic exam wnl bilaterally, visual fields grossly normal, pupils equal, round, reactive to light and accommodation III,IV, VI: ptosis not present, extra-ocular motions intact bilaterally V,VII: smile symmetric, facial light touch decreased on right, though splits midline VIII: hearing normal bilaterally IX,X: gag reflex present XI: trapezius strength/neck flexion  strength normal bilaterally XII: tongue strength normal  Motor: Right : Upper extremity    Left:     Upper extremity 5/5 deltoid       5/5 deltoid 5/5 biceps      5/5 biceps  5/5 triceps      5/5 triceps 5/5 hand grip      5/5 hand grip  Lower extremity     Lower extremity 5/5 hip flexor      5/5 hip flexor 5/5 quadricep      5/5 quadriceps  5/5 hamstrings     5/5 hamstrings 5/5 plantar flexion       5/5 plantar flexion 5/5 plantar  extension     5/5 plantar extension Tone and bulk:normal tone throughout; no atrophy noted Sensory: Pinprick and light touch intact throughout, bilaterally, notes paresthesia to LT on right side in no dermatomal distribution Deep Tendon Reflexes: 2+ and symmetric throughout Plantars: Right: downgoing   Left: downgoing Cerebellar: normal finger-to-nose,  and normal heel-to-shin test Gait: deferred  Laboratory Studies:   Basic Metabolic Panel:  Recent Labs Lab 05/28/15 0941  NA 142  K 3.9  CL 107  CO2 27  GLUCOSE 99  BUN 19  CREATININE 1.00  CALCIUM 9.2    Liver Function Tests: No results for input(s): AST, ALT, ALKPHOS, BILITOT, PROT, ALBUMIN in the last 168 hours. No results for input(s): LIPASE, AMYLASE in the last 168 hours. No results for input(s): AMMONIA in the last 168 hours.  CBC:  Recent Labs Lab 05/28/15 0941  WBC 5.0  HGB 11.0*  HCT 33.3*  MCV 81.4  PLT 259    Cardiac Enzymes:  Recent Labs Lab 05/28/15 1345  TROPONINI <0.03    BNP: Invalid input(s): POCBNP  CBG: No results for input(s): GLUCAP in the last 168 hours.  Microbiology: No results found for this or any previous visit.  Coagulation Studies: No results for input(s): LABPROT, INR in the last 72 hours.  Urinalysis: No results for input(s): COLORURINE, LABSPEC, PHURINE, GLUCOSEU, HGBUR, BILIRUBINUR, KETONESUR, PROTEINUR, UROBILINOGEN, NITRITE, LEUKOCYTESUR in the last 168 hours.  Invalid input(s): APPERANCEUR  Lipid Panel:  No results found for: CHOL, TRIG, HDL, CHOLHDL, VLDL, LDLCALC  HgbA1C: No results found for: HGBA1C  Urine Drug Screen:  No results found for: LABOPIA, COCAINSCRNUR, LABBENZ, AMPHETMU, THCU, LABBARB  Alcohol Level: No results for input(s): ETH in the last 168 hours.  Other results:  Imaging: Mr Brain Wo Contrast  05/28/2015   CLINICAL DATA:  Headache.  EXAM: MRI HEAD WITHOUT CONTRAST  TECHNIQUE: Multiplanar, multiecho pulse sequences of the brain and  surrounding structures were obtained without intravenous contrast.  COMPARISON:  CT head 03/28/2010  FINDINGS: Ventricle size is normal. Cerebral volume is normal for age. Pituitary not enlarged.  Negative for acute infarct.  Chronic microvascular ischemic changes in the cerebral white matter, mild in degree. Brainstem and cerebellum normal.  Chronic micro hemorrhage left frontal lobe. This is a small focal area .  Negative for mass or edema.  No shift of the midline structures  Mucosal edema in the paranasal sinuses without air-fluid level.  IMPRESSION: Mild chronic microvascular ischemic change in the white matter. No acute infarct or mass  Chronic sinusitis.   Electronically Signed   By: Franchot Gallo M.D.   On: 05/28/2015 19:43   Dg Chest Port 1 View  05/28/2015   CLINICAL DATA:  Chest pain, right arm and neck pain  EXAM: PORTABLE CHEST 1 VIEW  COMPARISON:  02/22/2008  FINDINGS:  The heart size and mediastinal contours are within normal limits. Both lungs are clear. The visualized skeletal structures are unremarkable.  IMPRESSION: No active disease.   Electronically Signed   By: Kathreen Devoid   On: 05/28/2015 09:41     Assessment/Plan:  64y/o woman hx of HTN, HLD, cervical disc disease admitted with chest pain and RUE paresthesias. MRI brain imaging reviewed and overall unremarkable with no evidence of acute infarct. Based on history and prior MRI spine imaging suspect her symptoms are consistent with a possible cervical radiculopathy.   Discussed possible MRI C spine with patient and her family. They wish to hold off on further imaging at this time.   -MRI C spine as outpatient (patinet does not wish to have at this time) -may consider EMG/NCS as outpatient  Jim Like, DO [facility] [phone]  If 7pm- 7am, please page neurology on call as listed in [facility]. 05/28/2015, 7:56 PM

## 2015-05-28 NOTE — ED Notes (Signed)
Admitting at bedside 

## 2015-05-28 NOTE — Progress Notes (Addendum)
Dr. Charlies Silvers text paged to be made aware of patient's NIH score of 3 with partial gaze palsy and ataxia. MRI currently ordered. Dr. Charlies Silvers telephone order that she will place orders and to transfer to a neuro unit.

## 2015-05-28 NOTE — ED Notes (Signed)
Attempted report to 2W °

## 2015-05-28 NOTE — Consult Note (Signed)
CARDIOLOGY CONSULT NOTE   Patient ID: Kendra Deleon MRN: [id number], DOB/AGE: 1950/11/19   Admit date: 05/28/2015 Date of Consult: 05/28/2015   Primary Physician: Simona Huh, MD Primary Cardiologist: new - Dr. Burt Knack  Pt. Profile  pleasant  64 year old [place] female with past medical history of HTN, HLD and GERD presented with CP since last night.  Problem List  Past Medical History  Diagnosis Date  . Hypertension   . Hyperlipidemia   . Depression     Past Surgical History  Procedure Laterality Date  . Oophorectomy    . Breast reduction surgery       Allergies  No Known Allergies  HPI   The patient is a pleasant 64 year old [place] female with past medical history of HTN, HLD and GERD. She had long-standing history of hypertension and hyperlipidemia, however has been compliant with her medication. She has family history of CAD, however most of the family who have CAD occurred after age 47. She never smoked, denies any drinking or illicit drug use history. She was recently retired from a school administrative position. She does not regularly do any strenuous activity. A few weeks ago, she did go hiking with a friend in the mountains without problem. Yesterday she also loaded and unloaded some flower pots on her balcony without problem. Yesterday afternoon, she prepared a lot of food for upcoming Jewish holiday in the next few weeks. Around 7:30 PM, she started having substernal chest discomfort radiating to the right arm, shoulder and right jaw. She admits to some tingling sensation in the fingers. She described the chest discomfort as a dull ache. It is slightly worse with deep inspiration and rotating to the right side. She denies any SOB, diaphoresis and dizziness. There has been no recent infection, cough, LE edema, orthopnea and PND. She eventually went to sleep a hour later while continued to have chest pain. She did have a good night's sleep. She woke up around 7:30 AM  this morning by her husband since she was supposed to go to a meeting at 10 AM. She noticed on awakening that she continued to have this right-sided pain. She went to walk the dog and chest discomfort became worse. This eventually prompted her to seek medical attention at [facility].   On initial arrival, she is bradycardic into the high 40s to 50s, blood pressure stable in the 100 to 120s. Lab shows creatinine 1.00, hemoglobin 11.0. Chest x-ray is negative for acute process. EKG was normal sinus bradycardia without significant ST-T wave changes. Cardiology consulted for CP.    Inpatient Medications  . sodium chloride   Intravenous STAT  . aspirin  324 mg Oral Once  . [START ON 05/29/2015] aspirin  325 mg Oral Daily  . enoxaparin (LOVENOX) injection  40 mg Subcutaneous Q24H  . fenofibrate  160 mg Oral Daily  . FLUoxetine  20 mg Oral Daily  . hydrochlorothiazide  12.5 mg Oral Daily  . lisinopril  20 mg Oral Daily  . pantoprazole  40 mg Oral Q0600  . simvastatin  40 mg Oral QHS    Family History Family History  Problem Relation Age of Onset  . Coronary artery disease Father     died at 20 from MI  . Diabetes Mellitus I Father     father had juvenile diabetes  . Hypertension Mother   . Hyperlipidemia Mother   . Breast cancer Sister   . Heart attack Maternal Grandfather   . Heart attack Paternal  Grandfather      Social History Social History   Social History  . Marital Status: Single    Spouse Name: N/A  . Number of Children: N/A  . Years of Education: N/A   Occupational History  . Not on file.   Social History Main Topics  . Smoking status: Never Smoker   . Smokeless tobacco: Not on file  . Alcohol Use: No  . Drug Use: No  . Sexual Activity: Not on file   Other Topics Concern  . Not on file   Social History Narrative     Review of Systems  General:  No chills, fever, night sweats or weight changes.  Cardiovascular:  No dyspnea on exertion, edema,  orthopnea, palpitations, paroxysmal nocturnal dyspnea. +chest pain Dermatological: No rash, lesions/masses Respiratory: No cough, dyspnea Urologic: No hematuria, dysuria Abdominal:   No nausea, vomiting, diarrhea, bright red blood per rectum, melena, or hematemesis Neurologic:  No visual changes, wkns, changes in mental status. All other systems reviewed and are otherwise negative except as noted above.  Physical Exam  Blood pressure 105/70, pulse 54, temperature 98.3 F (36.8 C), temperature source Oral, resp. rate 18, height 5\' 4"  (1.626 m), SpO2 100 %.  General: Pleasant, NAD Psych: Normal affect. Neuro: Alert and oriented X 3. Moves all extremities spontaneously. HEENT: Normal  Neck: Supple without bruits or JVD. Lungs:  Resp regular and unlabored, CTA. Heart: RRR no s3, s4, or murmurs. Abdomen: Soft, non-tender, non-distended, BS + x 4.  Extremities: No clubbing, cyanosis or edema. DP/PT/Radials 2+ and equal bilaterally.  Labs  No results for input(s): CKTOTAL, CKMB, TROPONINI in the last 72 hours. Lab Results  Component Value Date   WBC 5.0 05/28/2015   HGB 11.0* 05/28/2015   HCT 33.3* 05/28/2015   MCV 81.4 05/28/2015   PLT 259 05/28/2015     Recent Labs Lab 05/28/15 0941  NA 142  K 3.9  CL 107  CO2 27  BUN 19  CREATININE 1.00  CALCIUM 9.2  GLUCOSE 99    Radiology/Studies  Dg Chest Port 1 View  05/28/2015   CLINICAL DATA:  Chest pain, right arm and neck pain  EXAM: PORTABLE CHEST 1 VIEW  COMPARISON:  02/22/2008  FINDINGS: The heart size and mediastinal contours are within normal limits. Both lungs are clear. The visualized skeletal structures are unremarkable.  IMPRESSION: No active disease.   Electronically Signed   By: Kathreen Devoid   On: 05/28/2015 09:41    ECG  Sinus bradycardia without significant ST-T wave changes.  ASSESSMENT AND PLAN  1. Chest pain with some typical and atypical features  - prolonged episode without trop elevation, since 7:30  this morning, ongoing CP for last 7 hrs nonstop with negative trop  - cardiac risk factor: HTN, HLD, FHx  - will discuss with MD regarding ischemic workup, myoview vs stress test  2. HTN: continue HCTZ/ACEI combo, no BB with baseline bradycardia  3. HLD: continue statin  4. Baseline bradycardia  5. GERD   Signed, Almyra Deforest, Hershal Coria 05/28/2015, 2:31 PM  Agree with note by Almyra Deforest PA-C  Pt with + CRF HTN/HLD with prolonged CP with atypical features. STill has some mild CP. Exam benign. Enz -. EKG w/o acute changes. Plan Lexiscan myoview tomorrow to risk stratify. Further recs pending those results.    Lorretta Harp, M.D., [place], [facility], [place] [address] [place], [place]  [address]  [phone] 05/28/2015 3:03 PM

## 2015-05-28 NOTE — Progress Notes (Signed)
Kendra Deleon is a 64 y.o. female patient admitted from ED awake, alert - oriented  X 4 - no acute distress noted.  VSS - Blood pressure 105/70, pulse 54, temperature 98.3 F (36.8 C), temperature source Oral, resp. rate 18, height 5\' 4"  (1.626 m), SpO2 100 %.    IV in place, occlusive dsg intact without redness.  Orientation to room, and floor completed with information packet given to patient/family.  Patient declined safety video at this time.  Admission INP armband ID verified with patient/family, and in place.   SR up x 2, fall assessment complete, with patient and family able to verbalize understanding of risk associated with falls, and verbalized understanding to call nsg before up out of bed.  Call light within reach, patient able to voice, and demonstrate understanding.  Skin, clean-dry- intact without evidence of bruising, or skin tears.   No evidence of skin break down noted on exam.     Will cont to eval and treat per MD orders.  Janalyn Shy, RN 05/28/2015 2:20 PM

## 2015-05-28 NOTE — ED Notes (Signed)
Pt to ED via [facility] with complaint of central chest pain new in onset last night at 7:30 pm. Radiation into right arm and right jaw. Pt reports relief of arm pain with 2 nitro, but not jaw and chest pain. pt is a/o x4. VSS - 117/65, HR 65.  Pt received 324 aspirin in route.

## 2015-05-29 ENCOUNTER — Observation Stay (HOSPITAL_BASED_OUTPATIENT_CLINIC_OR_DEPARTMENT_OTHER): Payer: Medicare Other

## 2015-05-29 ENCOUNTER — Observation Stay (HOSPITAL_COMMUNITY): Payer: Medicare Other

## 2015-05-29 ENCOUNTER — Encounter (HOSPITAL_COMMUNITY)
Admission: EM | Disposition: A | Discharge status: Home / Self Care | Payer: Self-pay | Source: Home / Self Care | Attending: Emergency Medicine

## 2015-05-29 DIAGNOSIS — R931 Abnormal findings on diagnostic imaging of heart and coronary circulation: Secondary | ICD-10-CM | POA: Diagnosis not present

## 2015-05-29 DIAGNOSIS — I639 Cerebral infarction, unspecified: Secondary | ICD-10-CM

## 2015-05-29 DIAGNOSIS — I209 Angina pectoris, unspecified: Secondary | ICD-10-CM

## 2015-05-29 DIAGNOSIS — R0789 Other chest pain: Secondary | ICD-10-CM

## 2015-05-29 DIAGNOSIS — R079 Chest pain, unspecified: Secondary | ICD-10-CM | POA: Diagnosis not present

## 2015-05-29 DIAGNOSIS — K219 Gastro-esophageal reflux disease without esophagitis: Secondary | ICD-10-CM

## 2015-05-29 DIAGNOSIS — F329 Major depressive disorder, single episode, unspecified: Secondary | ICD-10-CM | POA: Diagnosis not present

## 2015-05-29 DIAGNOSIS — I10 Essential (primary) hypertension: Secondary | ICD-10-CM | POA: Diagnosis not present

## 2015-05-29 DIAGNOSIS — R9439 Abnormal result of other cardiovascular function study: Secondary | ICD-10-CM | POA: Insufficient documentation

## 2015-05-29 DIAGNOSIS — E785 Hyperlipidemia, unspecified: Secondary | ICD-10-CM | POA: Diagnosis not present

## 2015-05-29 HISTORY — PX: CARDIAC CATHETERIZATION: SHX172

## 2015-05-29 LAB — NM MYOCAR MULTI W/SPECT W/WALL MOTION / EF
CSEPED: 0 min
CSEPEDS: 0 s
CSEPEW: 1 METS
CSEPPHR: 91 {beats}/min
MPHR: 155 {beats}/min
Percent HR: 58 %
Rest HR: 50 {beats}/min

## 2015-05-29 LAB — PROTIME-INR
INR: 1.2 (ref 0.00–1.49)
PROTHROMBIN TIME: 15.3 s — AB (ref 11.6–15.2)

## 2015-05-29 LAB — LIPID PANEL
CHOL/HDL RATIO: 3.3 ratio
CHOLESTEROL: 158 mg/dL (ref 0–200)
HDL: 48 mg/dL (ref >40)
LDL CALC: 91 mg/dL (ref 0–99)
Triglycerides: 93 mg/dL (ref <150)
VLDL: 19 mg/dL (ref 0–40)

## 2015-05-29 LAB — TROPONIN I

## 2015-05-29 SURGERY — LEFT HEART CATH AND CORONARY ANGIOGRAPHY

## 2015-05-29 MED ORDER — SODIUM CHLORIDE 0.9 % IJ SOLN: 3.0000 mL | INTRAMUSCULAR | Status: DC | PRN

## 2015-05-29 MED ORDER — ASPIRIN 81 MG PO CHEW
81.0000 mg | CHEWABLE_TABLET | ORAL | Status: AC
  Administered 2015-05-29: 81 mg via ORAL

## 2015-05-29 MED ORDER — SODIUM CHLORIDE 0.9 % WEIGHT BASED INFUSION: 1.0000 mL/kg/h | INTRAVENOUS | Status: DC

## 2015-05-29 MED ORDER — VERAPAMIL HCL 2.5 MG/ML IV SOLN
INTRAVENOUS | Status: AC
  Filled 2015-05-29: qty 2

## 2015-05-29 MED ORDER — SODIUM CHLORIDE 0.9 % IJ SOLN
3.0000 mL | INTRAMUSCULAR | Status: DC | PRN
Start: 2015-05-29 — End: 2015-05-30

## 2015-05-29 MED ORDER — FENTANYL CITRATE (PF) 100 MCG/2ML IJ SOLN
INTRAMUSCULAR | Status: AC
  Filled 2015-05-29: qty 4

## 2015-05-29 MED ORDER — MIDAZOLAM HCL 2 MG/2ML IJ SOLN
INTRAMUSCULAR | Status: AC
  Filled 2015-05-29: qty 4

## 2015-05-29 MED ORDER — NITROGLYCERIN 1 MG/10 ML FOR IR/CATH LAB
INTRA_ARTERIAL | Status: AC
Start: 2015-05-29 — End: 2015-05-29
  Filled 2015-05-29: qty 10

## 2015-05-29 MED ORDER — SODIUM CHLORIDE 0.9 % IJ SOLN
3.0000 mL | Freq: Two times a day (BID) | INTRAMUSCULAR | Status: DC
Start: 2015-05-29 — End: 2015-05-29
  Administered 2015-05-29: 3 mL via INTRAVENOUS

## 2015-05-29 MED ORDER — HEPARIN (PORCINE) IN NACL 2-0.9 UNIT/ML-% IJ SOLN
INTRAMUSCULAR | Status: AC
  Filled 2015-05-29: qty 1000

## 2015-05-29 MED ORDER — MIDAZOLAM HCL 2 MG/2ML IJ SOLN
INTRAMUSCULAR | Status: DC | PRN
  Administered 2015-05-29: 2 mg via INTRAVENOUS

## 2015-05-29 MED ORDER — ONDANSETRON HCL 4 MG/2ML IJ SOLN: 4.0000 mg | Freq: Four times a day (QID) | INTRAMUSCULAR | Status: DC | PRN

## 2015-05-29 MED ORDER — ACETAMINOPHEN 325 MG PO TABS: 650.0000 mg | ORAL_TABLET | ORAL | Status: DC | PRN

## 2015-05-29 MED ORDER — LIDOCAINE HCL (PF) 1 % IJ SOLN
INTRAMUSCULAR | Status: AC
  Filled 2015-05-29: qty 30

## 2015-05-29 MED ORDER — REGADENOSON 0.4 MG/5ML IV SOLN
0.4000 mg | Freq: Once | INTRAVENOUS | Status: AC
Start: 2015-05-29 — End: 2015-05-29
  Administered 2015-05-29: 0.4 mg via INTRAVENOUS
  Filled 2015-05-29: qty 5

## 2015-05-29 MED ORDER — HEPARIN SODIUM (PORCINE) 1000 UNIT/ML IJ SOLN
INTRAMUSCULAR | Status: DC | PRN
  Administered 2015-05-29: 3500 [IU] via INTRAVENOUS

## 2015-05-29 MED ORDER — SODIUM CHLORIDE 0.9 % IV SOLN
250.0000 mL | INTRAVENOUS | Status: DC | PRN
Start: 2015-05-29 — End: 2015-05-30

## 2015-05-29 MED ORDER — HEPARIN (PORCINE) IN NACL 2-0.9 UNIT/ML-% IJ SOLN
INTRAMUSCULAR | Status: DC | PRN
  Administered 2015-05-29: 17:00:00

## 2015-05-29 MED ORDER — TECHNETIUM TC 99M SESTAMIBI GENERIC - CARDIOLITE
10.0000 | Freq: Once | INTRAVENOUS | Status: AC | PRN
  Administered 2015-05-29: 10 via INTRAVENOUS

## 2015-05-29 MED ORDER — FENTANYL CITRATE (PF) 100 MCG/2ML IJ SOLN
INTRAMUSCULAR | Status: DC | PRN
  Administered 2015-05-29: 25 ug via INTRAVENOUS

## 2015-05-29 MED ORDER — SODIUM CHLORIDE 0.9 % WEIGHT BASED INFUSION
3.0000 mL/kg/h | INTRAVENOUS | Status: DC
  Administered 2015-05-29: 3 mL/kg/h via INTRAVENOUS

## 2015-05-29 MED ORDER — TECHNETIUM TC 99M SESTAMIBI GENERIC - CARDIOLITE
30.0000 | Freq: Once | INTRAVENOUS | Status: AC | PRN
  Administered 2015-05-29: 30 via INTRAVENOUS

## 2015-05-29 MED ORDER — VERAPAMIL HCL 2.5 MG/ML IV SOLN
INTRAVENOUS | Status: DC | PRN
  Administered 2015-05-29: 17:00:00 via INTRA_ARTERIAL

## 2015-05-29 MED ORDER — HEPARIN SODIUM (PORCINE) 1000 UNIT/ML IJ SOLN
INTRAMUSCULAR | Status: AC
  Filled 2015-05-29: qty 1

## 2015-05-29 MED ORDER — SODIUM CHLORIDE 0.9 % IV SOLN: 250.0000 mL | INTRAVENOUS | Status: DC | PRN

## 2015-05-29 MED ORDER — REGADENOSON 0.4 MG/5ML IV SOLN
INTRAVENOUS | Status: AC
  Administered 2015-05-29: 0.4 mg via INTRAVENOUS
  Filled 2015-05-29: qty 5

## 2015-05-29 MED ORDER — SODIUM CHLORIDE 0.9 % WEIGHT BASED INFUSION: 3.0000 mL/kg/h | INTRAVENOUS | Status: DC

## 2015-05-29 MED ORDER — SODIUM CHLORIDE 0.9 % IJ SOLN: 3.0000 mL | Freq: Two times a day (BID) | INTRAMUSCULAR | Status: DC

## 2015-05-29 MED ORDER — SODIUM CHLORIDE 0.9 % WEIGHT BASED INFUSION: 1.0000 mL/kg/h | INTRAVENOUS | Status: AC

## 2015-05-29 SURGICAL SUPPLY — 11 items
CATH INFINITI 5 FR JL3.5 (CATHETERS) ×3 IMPLANT
CATH INFINITI JR4 5F (CATHETERS) ×3 IMPLANT
DEVICE RAD COMP TR BAND LRG (VASCULAR PRODUCTS) ×3 IMPLANT
GLIDESHEATH SLEND SS 6F .021 (SHEATH) ×3 IMPLANT
KIT HEART LEFT (KITS) ×3 IMPLANT
PACK CARDIAC CATHETERIZATION (CUSTOM PROCEDURE TRAY) ×3 IMPLANT
SYR MEDRAD MARK V 150ML (SYRINGE) ×3 IMPLANT
TRANSDUCER W/STOPCOCK (MISCELLANEOUS) ×3 IMPLANT
TUBING CIL FLEX 10 FLL-RA (TUBING) ×3 IMPLANT
WIRE HI TORQ VERSACORE-J 145CM (WIRE) ×2 IMPLANT
WIRE SAFE-T 1.5MM-J .035X260CM (WIRE) ×3 IMPLANT

## 2015-05-29 NOTE — Progress Notes (Signed)
Ppatient went down for stress test around 0830 she will also have doppler and echo done while down there, she returned from procedures and was informed by cardiology that she would need stent placement, she will be going down ffor that procedure around 1830. Will continue to monitor.

## 2015-05-29 NOTE — Discharge Summary (Addendum)
PATIENT DETAILS Name: Kendra Deleon Age: 64 y.o. Sex: female Date of Birth: November 03, 1950 MRN: [id number]. Admitting Physician: Robbie Lis, MD YQM:[id number],EXBMWU R, MD  Admit Date: 05/28/2015 Discharge date: 05/29/2015  Recommendations for Outpatient Follow-up:  1. Age appropriate gen health maintenance 2. Aggressive risk factor modification 3. Ensure follow up with Neurosurgery-Dr Sherwood Gambler  PRIMARY DISCHARGE DIAGNOSIS:  Principal Problem:   Chest pain Active Problems:   Depression   Dyslipidemia   Benign essential HTN   Normocytic anemia   GERD (gastroesophageal reflux disease)   Paresthesia   Headache   Paresthesias   Atypical chest pain   Abnormal nuclear stress test      PAST MEDICAL HISTORY: Past Medical History  Diagnosis Date  . Hypertension   . Hyperlipidemia   . Depression     DISCHARGE MEDICATIONS: Current Discharge Medication List    CONTINUE these medications which have NOT CHANGED   Details  aspirin 325 MG tablet Take 325 mg by mouth once.    Calcium Carbonate-Vitamin D (CALTRATE 600+D PO) Take 1 tablet by mouth daily.    Cholecalciferol (VITAMIN D3) 2000 UNITS TABS Take 1 tablet by mouth daily.    fenofibrate 160 MG tablet Take 160 mg by mouth daily.     FLUoxetine (PROZAC) 20 MG capsule Take 20 mg by mouth daily.     lisinopril-hydrochlorothiazide (PRINZIDE,ZESTORETIC) 20-12.5 MG per tablet Take 1 tablet by mouth daily.     pantoprazole (PROTONIX) 40 MG tablet Take 40 mg by mouth daily.     simvastatin (ZOCOR) 40 MG tablet Take 40 mg by mouth daily.         ALLERGIES:  No Known Allergies  BRIEF HPI:  See H&P, Labs, Consult and Test reports for all details in brief, patient 64 year old female with past medical history of hypertension, dyslipidemia, depression who presented to [facility] ED with substernal chest pain.She also presented with right side upper extremity, hand tingling sensatio  CONSULTATIONS:   cardiology and  neurology  PERTINENT RADIOLOGIC STUDIES: Mr Brain Wo Contrast  05/28/2015   CLINICAL DATA:  Headache.  EXAM: MRI HEAD WITHOUT CONTRAST  TECHNIQUE: Multiplanar, multiecho pulse sequences of the brain and surrounding structures were obtained without intravenous contrast.  COMPARISON:  CT head 03/28/2010  FINDINGS: Ventricle size is normal. Cerebral volume is normal for age. Pituitary not enlarged.  Negative for acute infarct.  Chronic microvascular ischemic changes in the cerebral white matter, mild in degree. Brainstem and cerebellum normal.  Chronic micro hemorrhage left frontal lobe. This is a small focal area .  Negative for mass or edema.  No shift of the midline structures  Mucosal edema in the paranasal sinuses without air-fluid level.  IMPRESSION: Mild chronic microvascular ischemic change in the white matter. No acute infarct or mass  Chronic sinusitis.   Electronically Signed   By: Franchot Gallo M.D.   On: 05/28/2015 19:43   Nm Myocar Multi W/spect W/wall Motion / Ef  05/29/2015    There was no ST segment deviation noted during stress.  Defect 1: There is a small defect of mild severity present in the basal  inferoseptal, basal inferior and mid inferoseptal location. There is a  significant amount of extracardiac activity overlying the inferior wall as  well as large pendulous breasts that make interpretation of ischemia vs.  artifact difficult. There is also evidence of transient ischemic  dilatation  Nuclear stress EF: 68%.  The left ventricular ejection fraction is hyperdynamic (>65%).  This is  an intermediate risk study.    Dg Chest Port 1 View  05/28/2015   CLINICAL DATA:  Chest pain, right arm and neck pain  EXAM: PORTABLE CHEST 1 VIEW  COMPARISON:  02/22/2008  FINDINGS: The heart size and mediastinal contours are within normal limits. Both lungs are clear. The visualized skeletal structures are unremarkable.  IMPRESSION: No active disease.   Electronically Signed   By: Kathreen Devoid    On: 05/28/2015 09:41     PERTINENT LAB RESULTS: CBC:  Recent Labs  05/28/15 0941  WBC 5.0  HGB 11.0*  HCT 33.3*  PLT 259   CMET CMP     Component Value Date/Time   NA 142 05/28/2015 0941   K 3.9 05/28/2015 0941   CL 107 05/28/2015 0941   CO2 27 05/28/2015 0941   GLUCOSE 99 05/28/2015 0941   BUN 19 05/28/2015 0941   CREATININE 1.00 05/28/2015 0941   CALCIUM 9.2 05/28/2015 0941   GFRNONAA 58* 05/28/2015 0941   GFRAA >60 05/28/2015 0941    GFR Estimated Creatinine Clearance: 53.9 mL/min (by C-G formula based on Cr of 1). No results for input(s): LIPASE, AMYLASE in the last 72 hours.  Recent Labs  05/28/15 1345 05/28/15 2104 05/29/15 0143  TROPONINI <0.03 <0.03 <0.03   Invalid input(s): POCBNP No results for input(s): DDIMER in the last 72 hours. No results for input(s): HGBA1C in the last 72 hours.  Recent Labs  05/29/15 0143  CHOL 158  HDL 48  LDLCALC 91  TRIG 93  CHOLHDL 3.3   No results for input(s): TSH, T4TOTAL, T3FREE, THYROIDAB in the last 72 hours.  Invalid input(s): FREET3 No results for input(s): VITAMINB12, FOLATE, FERRITIN, TIBC, IRON, RETICCTPCT in the last 72 hours. Coags:  Recent Labs  05/29/15 1555  INR 1.20   Microbiology: No results found for this or any previous visit (from the past 240 hour(s)).   BRIEF HOSPITAL COURSE:  Chest pain:no chest pain this am, troponin neg-underwent Nuc stress-which showed Ischemia. Subsequently underwent LHC which was negative.Ok to d/c per cards-continue ASA, statin on discharge.  Active Problems:  Depression:stable-continue Prozac   Dyslipidemia:continue Dyslipidemia   Benign essential QMG:[id number] Lisinopril,HCTZ   GERD (gastroesophageal reflux disease):continue PPI   RUE paresthesias:seen by Neuro-from known cervical disch disease-MRI Brain neg. No further inpatient work up required.Patient asked to follow up with Dr Sherwood Gambler.  TODAY-DAY OF DISCHARGE:  Subjective:    Kendra Deleon today has no headache,no chest abdominal pain,no new weakness tingling or numbness, feels much better wants to go home today.  Objective:   Blood pressure 123/64, pulse 53, temperature 98.4 F (36.9 C), temperature source Oral, resp. rate 18, height 5\' 4"  (1.626 m), weight 70.308 kg (155 lb), SpO2 93 %.  Intake/Output Summary (Last 24 hours) at 05/29/15 1821 Last data filed at 05/29/15 1615  Gross per 24 hour  Intake    503 ml  Output      0 ml  Net    503 ml   Filed Weights   05/28/15 1807  Weight: 70.308 kg (155 lb)    Exam Awake Alert, Oriented *3, No new F.N deficits, Normal affect [place].AT,PERRAL Supple Neck,No JVD, No cervical lymphadenopathy appriciated.  Symmetrical Chest wall movement, Good air movement bilaterally, CTAB RRR,No Gallops,Rubs or new Murmurs, No Parasternal Heave +ve B.Sounds, Abd Soft, Non tender, No organomegaly appriciated, No rebound -guarding or rigidity. No Cyanosis, Clubbing or edema, No new Rash or bruise  DISCHARGE CONDITION: Stable  DISPOSITION: Home  DISCHARGE  INSTRUCTIONS:    Activity:  As tolerated   Get Medicines reviewed and adjusted: Please take all your medications with you for your next visit with your Primary MD  Please request your Primary MD to go over all hospital tests and procedure/radiological results at the follow up, please ask your Primary MD to get all Hospital records sent to his/her office.  If you experience worsening of your admission symptoms, develop shortness of breath, life threatening emergency, suicidal or homicidal thoughts you must seek medical attention immediately by calling 911 or calling your MD immediately  if symptoms less severe.  You must read complete instructions/literature along with all the possible adverse reactions/side effects for all the Medicines you take and that have been prescribed to you. Take any new Medicines after you have completely understood and accpet all the possible  adverse reactions/side effects.   Do not drive when taking Pain medications.   Do not take more than prescribed Pain, Sleep and Anxiety Medications  Special Instructions: If you have smoked or chewed Tobacco  in the last 2 yrs please stop smoking, stop any regular Alcohol  and or any Recreational drug use.  Wear Seat belts while driving.  Please note  You were cared for by a hospitalist during your hospital stay. Once you are discharged, your primary care physician will handle any further medical issues. Please note that NO REFILLS for any discharge medications will be authorized once you are discharged, as it is imperative that you return to your primary care physician (or establish a relationship with a primary care physician if you do not have one) for your aftercare needs so that they can reassess your need for medications and monitor your lab values.   Diet recommendation: Heart Healthy diet  Discharge Instructions    Call MD for:  persistant dizziness or light-headedness    Complete by:  As directed      Call MD for:  persistant nausea and vomiting    Complete by:  As directed      Call MD for:  severe uncontrolled pain    Complete by:  As directed      Diet - low sodium heart healthy    Complete by:  As directed      Increase activity slowly    Complete by:  As directed            Follow-up Information    Follow up with Simona Huh, MD. Schedule an appointment as soon as possible for a visit in 1 week.   Specialty:  Family Medicine   Contact information:   [address] [place] [address] [phone]       Follow up with Hosie Spangle, MD. Schedule an appointment as soon as possible for a visit in 2 weeks.   Specialty:  Neurosurgery   Contact information:   [address] [place] [address] [phone]       Total Time spent on discharge equals 25  minutes.  SignedOren Binet 05/29/2015 6:21 PM

## 2015-05-29 NOTE — Progress Notes (Signed)
PATIENT DETAILS Name: Kendra Deleon Age: 64 y.o. Sex: female Date of Birth: 1950-12-28 Admit Date: 05/28/2015 Admitting Physician Robbie Lis, MD DDU:[id number],CWCBJS R, MD  Subjective: No further chest pain.  Assessment/Plan: Principal Problem:  Chest pain:no chest pain this am, troponin neg-underwent Nuc stress-which showed Ischemia. For LHC today. Cards following  Active Problems:   Depression:stable-continue Prozac    Dyslipidemia:continue Dyslipidemia    Benign essential EGB:[id number] Lisinopril,HCTZ    GERD (gastroesophageal reflux disease):continue PPI    RUE paresthesias:seen by Neuro-from known cervical disch disease-MRI Brain neg. No further inpatient work up required.  Disposition: Remain inpatient  Antimicrobial agents  See below  Anti-infectives    None      DVT Prophylaxis: Prophylactic Lovenox   Code Status: Full code   Family Communication None at bedside  Procedures: None  CONSULTS:  cardiology  Time spent 20 minutes-Greater than 50% of this time was spent in counseling, explanation of diagnosis, planning of further management, and coordination of care.  MEDICATIONS: Scheduled Meds: .  stroke: mapping our early stages of recovery book   Does not apply Once  . aspirin  324 mg Oral Once  . aspirin  325 mg Oral Daily  . enoxaparin (LOVENOX) injection  40 mg Subcutaneous Q24H  . fenofibrate  160 mg Oral Daily  . FLUoxetine  20 mg Oral Daily  . hydrochlorothiazide  12.5 mg Oral Daily  . lisinopril  20 mg Oral Daily  . pantoprazole  40 mg Oral Q0600  . simvastatin  40 mg Oral QHS   Continuous Infusions:  PRN Meds:.acetaminophen, gi cocktail, morphine injection, ondansetron (ZOFRAN) IV    PHYSICAL EXAM: Vital signs in last 24 hours: Filed Vitals:   05/29/15 1024 05/29/15 1026 05/29/15 1028 05/29/15 1030  BP: 133/76 129/71 135/68 125/71  Pulse: 86 88 81 79  Temp:      TempSrc:      Resp:        Height:      Weight:      SpO2:        Weight change:  Filed Weights   05/28/15 1807  Weight: 70.308 kg (155 lb)   Body mass index is 26.59 kg/(m^2).   Gen Exam: Awake and alert with clear speech.  Neck: Supple, No JVD.   Chest: B/L Clear.   CVS: S1 S2 Regular, no murmurs.  Abdomen: soft, BS +, non tender, non distended.  Extremities: no edema, lower extremities warm to touch. Neurologic: Non Focal.   Skin: No Rash.   Wounds: N/A.   Intake/Output from previous day:  Intake/Output Summary (Last 24 hours) at 05/29/15 1415 Last data filed at 05/28/15 1840  Gross per 24 hour  Intake    500 ml  Output      0 ml  Net    500 ml     LAB RESULTS: CBC  Recent Labs Lab 05/28/15 0941  WBC 5.0  HGB 11.0*  HCT 33.3*  PLT 259  MCV 81.4  MCH 26.9  MCHC 33.0  RDW 14.0    Chemistries   Recent Labs Lab 05/28/15 0941  NA 142  K 3.9  CL 107  CO2 27  GLUCOSE 99  BUN 19  CREATININE 1.00  CALCIUM 9.2    CBG: No results for input(s): GLUCAP in the last 168 hours.  GFR Estimated Creatinine Clearance: 53.9 mL/min (by C-G formula based on Cr of 1).  Coagulation profile No results for input(s): INR, PROTIME in the last 168 hours.  Cardiac Enzymes  Recent Labs Lab 05/28/15 1345 05/28/15 2104 05/29/15 0143  TROPONINI <0.03 <0.03 <0.03    Invalid input(s): POCBNP No results for input(s): DDIMER in the last 72 hours. No results for input(s): HGBA1C in the last 72 hours.  Recent Labs  05/29/15 0143  CHOL 158  HDL 48  LDLCALC 91  TRIG 93  CHOLHDL 3.3   No results for input(s): TSH, T4TOTAL, T3FREE, THYROIDAB in the last 72 hours.  Invalid input(s): FREET3 No results for input(s): VITAMINB12, FOLATE, FERRITIN, TIBC, IRON, RETICCTPCT in the last 72 hours. No results for input(s): LIPASE, AMYLASE in the last 72 hours.  Urine Studies No results for input(s): UHGB, CRYS in the last 72 hours.  Invalid input(s): UACOL, UAPR, USPG, UPH, UTP, UGL, UKET,  UBIL, UNIT, UROB, ULEU, UEPI, UWBC, URBC, UBAC, CAST, UCOM, BILUA  MICROBIOLOGY: No results found for this or any previous visit (from the past 240 hour(s)).  RADIOLOGY STUDIES/RESULTS: Mr Herby Abraham Contrast  05/28/2015   CLINICAL DATA:  Headache.  EXAM: MRI HEAD WITHOUT CONTRAST  TECHNIQUE: Multiplanar, multiecho pulse sequences of the brain and surrounding structures were obtained without intravenous contrast.  COMPARISON:  CT head 03/28/2010  FINDINGS: Ventricle size is normal. Cerebral volume is normal for age. Pituitary not enlarged.  Negative for acute infarct.  Chronic microvascular ischemic changes in the cerebral white matter, mild in degree. Brainstem and cerebellum normal.  Chronic micro hemorrhage left frontal lobe. This is a small focal area .  Negative for mass or edema.  No shift of the midline structures  Mucosal edema in the paranasal sinuses without air-fluid level.  IMPRESSION: Mild chronic microvascular ischemic change in the white matter. No acute infarct or mass  Chronic sinusitis.   Electronically Signed   By: Franchot Gallo M.D.   On: 05/28/2015 19:43   Dg Chest Port 1 View  05/28/2015   CLINICAL DATA:  Chest pain, right arm and neck pain  EXAM: PORTABLE CHEST 1 VIEW  COMPARISON:  02/22/2008  FINDINGS: The heart size and mediastinal contours are within normal limits. Both lungs are clear. The visualized skeletal structures are unremarkable.  IMPRESSION: No active disease.   Electronically Signed   By: Kathreen Devoid   On: 05/28/2015 09:41    Oren Binet, MD  [facility] Pager:[phone]  If 7PM-7AM, please contact night-coverage [email or web address] Password [email or web address] 05/29/2015, 2:15 PM

## 2015-05-29 NOTE — Care Management Note (Signed)
Case Management Note  Patient Details  Name: Kendra Deleon MRN: [id number] Date of Birth: Sep 18, 1950  Subjective/Objective:                    Action/Plan: Patient admitted with chest pain. Patient is from home with her spouse. CM will continue to follow for discharge needs.   Expected Discharge Date:                  Expected Discharge Plan:  Home/Self Care  In-House Referral:     Discharge planning Services     Post Acute Care Choice:    Choice offered to:     DME Arranged:    DME Agency:     HH Arranged:    HH Agency:     Status of Service:  In process, will continue to follow  Medicare Important Message Given:    Date Medicare IM Given:    Medicare IM give by:    Date Additional Medicare IM Given:    Additional Medicare Important Message give by:     If discussed at [place] of Stay Meetings, dates discussed:    Additional Comments:  Pollie Friar, RN 05/29/2015, 12:17 PM

## 2015-05-29 NOTE — Research (Signed)
CADLAD Informed Consent   Subject Name: Kendra Deleon  Subject met inclusion and exclusion criteria.  The informed consent form, study requirements and expectations were reviewed with the subject and questions and concerns were addressed prior to the signing of the consent form.  The subject verbalized understanding of the trail requirements.  The subject agreed to participate in the CADLAD trial and signed the informed consent.  The informed consent was obtained prior to performance of any protocol-specific procedures for the subject.  A copy of the signed informed consent was given to the subject and a copy was placed in the subject's medical record.  Hedrick,Tammy W 05/29/2015, 1500

## 2015-05-29 NOTE — Progress Notes (Signed)
  Echocardiogram 2D Echocardiogram has been performed.  GREGORY, ANGELA 05/29/2015, 1:07 PM

## 2015-05-29 NOTE — Progress Notes (Signed)
VASCULAR LAB PRELIMINARY  PRELIMINARY  PRELIMINARY  PRELIMINARY  Carotid duplex  completed.    Preliminary report:  Bilateral:  1-39% ICA stenosis.  Vertebral artery flow is antegrade.      Deleon,Kendra, RVT 05/29/2015, 12:37 PM

## 2015-05-29 NOTE — Progress Notes (Signed)
Patient Name: Kendra Deleon Date of Encounter: 05/29/2015  Primary Cardiologist: new - Dr. Gwenlyn Found   Principal Problem:   Chest pain Active Problems:   Depression   Dyslipidemia   Benign essential HTN   Normocytic anemia   GERD (gastroesophageal reflux disease)   Paresthesia   Headache   Paresthesias    SUBJECTIVE  CP went away last night, recurred this morning. No SOB  CURRENT MEDS .  stroke: mapping our early stages of recovery book   Does not apply Once  . aspirin  324 mg Oral Once  . aspirin  325 mg Oral Daily  . enoxaparin (LOVENOX) injection  40 mg Subcutaneous Q24H  . fenofibrate  160 mg Oral Daily  . FLUoxetine  20 mg Oral Daily  . hydrochlorothiazide  12.5 mg Oral Daily  . lisinopril  20 mg Oral Daily  . pantoprazole  40 mg Oral Q0600  . regadenoson      . regadenoson  0.4 mg Intravenous Once  . simvastatin  40 mg Oral QHS    OBJECTIVE  Filed Vitals:   05/29/15 0100 05/29/15 0300 05/29/15 0500 05/29/15 0700  BP: 96/60 100/55 107/60 124/63  Pulse: 49 50 56 52  Temp: 97.8 F (36.6 C) 97.9 F (36.6 C) 98.2 F (36.8 C) 98.7 F (37.1 C)  TempSrc: Oral Oral Oral Oral  Resp: 16 16 16 16   Height:      Weight:      SpO2: 100% 99%  100%    Intake/Output Summary (Last 24 hours) at 05/29/15 1011 Last data filed at 05/28/15 1840  Gross per 24 hour  Intake 718.33 ml  Output      0 ml  Net 718.33 ml   Filed Weights   05/28/15 1807  Weight: 155 lb (70.308 kg)    PHYSICAL EXAM  General: Pleasant, NAD. Neuro: Alert and oriented X 3. Moves all extremities spontaneously. Psych: Normal affect. HEENT:  Normal  Neck: Supple without bruits or JVD. Lungs:  Resp regular and unlabored, CTA. Heart: RRR no s3, s\4, or murmurs. Abdomen: Soft, non-tender, non-distended, BS + x 4.  Extremities: No clubbing, cyanosis or edema. DP/PT/Radials 2+ and equal bilaterally.  Accessory Clinical Findings  CBC  Recent Labs  05/28/15 0941  WBC 5.0  HGB 11.0*  HCT  33.3*  MCV 81.4  PLT 096   Basic Metabolic Panel  Recent Labs  05/28/15 0941  NA 142  K 3.9  CL 107  CO2 27  GLUCOSE 99  BUN 19  CREATININE 1.00  CALCIUM 9.2   Cardiac Enzymes  Recent Labs  05/28/15 1345 05/28/15 2104 05/29/15 0143  TROPONINI <0.03 <0.03 <0.03   Fasting Lipid Panel  Recent Labs  05/29/15 0143  CHOL 158  HDL 48  LDLCALC 91  TRIG 93  CHOLHDL 3.3      ECG  No new EKG  Echocardiogram  pending    Radiology/Studies  Mr Brain Wo Contrast  05/28/2015   CLINICAL DATA:  Headache.  EXAM: MRI HEAD WITHOUT CONTRAST  TECHNIQUE: Multiplanar, multiecho pulse sequences of the brain and surrounding structures were obtained without intravenous contrast.  COMPARISON:  CT head 03/28/2010  FINDINGS: Ventricle size is normal. Cerebral volume is normal for age. Pituitary not enlarged.  Negative for acute infarct.  Chronic microvascular ischemic changes in the cerebral white matter, mild in degree. Brainstem and cerebellum normal.  Chronic micro hemorrhage left frontal lobe. This is a small focal area .  Negative for mass or  edema.  No shift of the midline structures  Mucosal edema in the paranasal sinuses without air-fluid level.  IMPRESSION: Mild chronic microvascular ischemic change in the white matter. No acute infarct or mass  Chronic sinusitis.   Electronically Signed   By: Franchot Gallo M.D.   On: 05/28/2015 19:43   Dg Chest Port 1 View  05/28/2015   CLINICAL DATA:  Chest pain, right arm and neck pain  EXAM: PORTABLE CHEST 1 VIEW  COMPARISON:  02/22/2008  FINDINGS: The heart size and mediastinal contours are within normal limits. Both lungs are clear. The visualized skeletal structures are unremarkable.  IMPRESSION: No active disease.   Electronically Signed   By: Kathreen Devoid   On: 05/28/2015 09:41    ASSESSMENT AND PLAN  pleasant 64 year old [place] female with past medical history of HTN, HLD and GERD presented with CP since last night.  1. Chest pain  with some typical and atypical features - prolonged episode without trop elevation, also worse with deep inspiration - cardiac risk factor: HTN, HLD, FHx - plan for myoview this morning  2. RUE paresthesia  - seen by neurology, MRI of brain negative for acute process, felt it is like related to her cervical radiculopathy  - she refused inpatient MRI of C Spine, will consider outpatient MRI of C spine  3. HTN: continue HCTZ/ACEI combo, no BB with baseline bradycardia  4. HLD: continue statin  5. Baseline bradycardia  6. GERD  Signed, Woodward Ku Pager: [phone]   Agree with note by Almyra Deforest PA-C  I have personally reviewed MV images from this AM. There is mild ant ischemia. Based on this, the pts age, + CRFs and Sx I favor cor angio this afternoon to definatively. The patient understands that risks included but are not limited to stroke (1 in 1000), death (1 in 90), kidney failure [usually temporary] (1 in 500), bleeding (1 in 200), allergic reaction [possibly serious] (1 in 200). The patient understands and agrees to proceed   Lorretta Harp, M.D., [place], [facility], [place], [place] [address] [place], [place]  [address]  [phone] 05/29/2015 1:08 PM

## 2015-05-29 NOTE — Progress Notes (Signed)
Patients belongings were picked up from [place] by 3W nurse tech and taken to patient on 3W 17

## 2015-05-29 NOTE — Progress Notes (Signed)
1 day [employer] completed without significant complication. Pending final result by [facility] reader  Signed, Almyra Deforest PA Pager: [phone]

## 2015-05-29 NOTE — H&P (View-Only) (Signed)
PATIENT DETAILS Name: Kendra Deleon Age: 64 y.o. Sex: female Date of Birth: [birth year] Admit Date: 05/28/2015 Admitting Physician Robbie Lis, MD [id number],[id number] R, MD  Subjective: No further chest pain.  Assessment/Plan: Principal Problem:  Chest pain:no chest pain this am, troponin neg-underwent Nuc stress-which showed Ischemia. For LHC today. Cards following  Active Problems:   Depression:stable-continue Prozac    Dyslipidemia:continue Dyslipidemia    Benign essential GUY:[id number]-GLOVFIEP Lisinopril,HCTZ    GERD (gastroesophageal reflux disease):continue PPI    RUE paresthesias:seen by Neuro-from known cervical disch disease-MRI Brain neg. No further inpatient work up required.  Disposition: Remain inpatient  Antimicrobial agents  See below  Anti-infectives    None      DVT Prophylaxis: Prophylactic Lovenox   Code Status: Full code   Family Communication None at bedside  Procedures: None  CONSULTS:  cardiology  Time spent 20 minutes-Greater than 50% of this time was spent in counseling, explanation of diagnosis, planning of further management, and coordination of care.  MEDICATIONS: Scheduled Meds: .  stroke: mapping our early stages of recovery book   Does not apply Once  . aspirin  324 mg Oral Once  . aspirin  325 mg Oral Daily  . enoxaparin (LOVENOX) injection  40 mg Subcutaneous Q24H  . fenofibrate  160 mg Oral Daily  . FLUoxetine  20 mg Oral Daily  . hydrochlorothiazide  12.5 mg Oral Daily  . lisinopril  20 mg Oral Daily  . pantoprazole  40 mg Oral Q0600  . simvastatin  40 mg Oral QHS   Continuous Infusions:  PRN Meds:.acetaminophen, gi cocktail, morphine injection, ondansetron (ZOFRAN) IV    PHYSICAL EXAM: Vital signs in last 24 hours: Filed Vitals:   05/29/15 1024 05/29/15 1026 05/29/15 1028 05/29/15 1030  BP: 133/76 129/71 135/68 125/71  Pulse: 86 88 81 79  Temp:      TempSrc:      Resp:        Height:      Weight:      SpO2:        Weight change:  Filed Weights   05/28/15 1807  Weight: 70.308 kg (155 lb)   Body mass index is 26.59 kg/(m^2).   Gen Exam: Awake and alert with clear speech.  Neck: Supple, No JVD.   Chest: B/L Clear.   CVS: S1 S2 Regular, no murmurs.  Abdomen: soft, BS +, non tender, non distended.  Extremities: no edema, lower extremities warm to touch. Neurologic: Non Focal.   Skin: No Rash.   Wounds: N/A.   Intake/Output from previous day:  Intake/Output Summary (Last 24 hours) at 05/29/15 1415 Last data filed at 05/28/15 1840  Gross per 24 hour  Intake    500 ml  Output      0 ml  Net    500 ml     LAB RESULTS: CBC  Recent Labs Lab 05/28/15 0941  WBC 5.0  HGB 11.0*  HCT 33.3*  PLT 259  MCV 81.4  MCH 26.9  MCHC 33.0  RDW 14.0    Chemistries   Recent Labs Lab 05/28/15 0941  NA 142  K 3.9  CL 107  CO2 27  GLUCOSE 99  BUN 19  CREATININE 1.00  CALCIUM 9.2    CBG: No results for input(s): GLUCAP in the last 168 hours.  GFR Estimated Creatinine Clearance: 53.9 mL/min (by C-G formula based on Cr of 1).  Coagulation profile No results for input(s): INR, PROTIME in the last 168 hours.  Cardiac Enzymes  Recent Labs Lab 05/28/15 1345 05/28/15 2104 05/29/15 0143  TROPONINI <0.03 <0.03 <0.03    Invalid input(s): POCBNP No results for input(s): DDIMER in the last 72 hours. No results for input(s): HGBA1C in the last 72 hours.  Recent Labs  05/29/15 0143  CHOL 158  HDL 48  LDLCALC 91  TRIG 93  CHOLHDL 3.3   No results for input(s): TSH, T4TOTAL, T3FREE, THYROIDAB in the last 72 hours.  Invalid input(s): FREET3 No results for input(s): VITAMINB12, FOLATE, FERRITIN, TIBC, IRON, RETICCTPCT in the last 72 hours. No results for input(s): LIPASE, AMYLASE in the last 72 hours.  Urine Studies No results for input(s): UHGB, CRYS in the last 72 hours.  Invalid input(s): UACOL, UAPR, USPG, UPH, UTP, UGL, UKET,  UBIL, UNIT, UROB, ULEU, UEPI, UWBC, URBC, UBAC, CAST, UCOM, BILUA  MICROBIOLOGY: No results found for this or any previous visit (from the past 240 hour(s)).  RADIOLOGY STUDIES/RESULTS: Mr Herby Abraham Contrast  05/28/2015   CLINICAL DATA:  Headache.  EXAM: MRI HEAD WITHOUT CONTRAST  TECHNIQUE: Multiplanar, multiecho pulse sequences of the brain and surrounding structures were obtained without intravenous contrast.  COMPARISON:  CT head 03/28/2010  FINDINGS: Ventricle size is normal. Cerebral volume is normal for age. Pituitary not enlarged.  Negative for acute infarct.  Chronic microvascular ischemic changes in the cerebral white matter, mild in degree. Brainstem and cerebellum normal.  Chronic micro hemorrhage left frontal lobe. This is a small focal area .  Negative for mass or edema.  No shift of the midline structures  Mucosal edema in the paranasal sinuses without air-fluid level.  IMPRESSION: Mild chronic microvascular ischemic change in the white matter. No acute infarct or mass  Chronic sinusitis.   Electronically Signed   By: Franchot Gallo M.D.   On: 05/28/2015 19:43   Dg Chest Port 1 View  05/28/2015   CLINICAL DATA:  Chest pain, right arm and neck pain  EXAM: PORTABLE CHEST 1 VIEW  COMPARISON:  02/22/2008  FINDINGS: The heart size and mediastinal contours are within normal limits. Both lungs are clear. The visualized skeletal structures are unremarkable.  IMPRESSION: No active disease.   Electronically Signed   By: Kathreen Devoid   On: 05/28/2015 09:41    Oren Binet, MD  [facility] Pager:[phone]  If 7PM-7AM, please contact night-coverage [email or web address] Password [email or web address] 05/29/2015, 2:15 PM

## 2015-05-29 NOTE — Progress Notes (Addendum)
Patient went down cardiac cath lab for procedure and will be transferred to cardiac step down unit after procedure.

## 2015-05-29 NOTE — Interval H&P Note (Signed)
Cath Lab Visit (complete for each Cath Lab visit)  Clinical Evaluation Leading to the Procedure:   ACS: Yes.    Non-ACS:    Anginal Classification: CCS IV  Anti-ischemic medical therapy: No Therapy  Non-Invasive Test Results: Intermediate-risk stress test findings: cardiac mortality 1-3%/year  Prior CABG: No previous CABG      History and Physical Interval Note:  05/29/2015 4:58 PM  Kendra Deleon  has presented today for surgery, with the diagnosis of cp  The various methods of treatment have been discussed with the patient and family. After consideration of risks, benefits and other options for treatment, the patient has consented to  Procedure(s): Left Heart Cath and Coronary Angiography (N/A) as a surgical intervention .  The patient's history has been reviewed, patient examined, no change in status, stable for surgery.  I have reviewed the patient's chart and labs.  Questions were answered to the patient's satisfaction.     VARANASI,JAYADEEP S.

## 2015-05-30 ENCOUNTER — Encounter (HOSPITAL_COMMUNITY): Payer: Self-pay | Admitting: Interventional Cardiology

## 2015-05-30 DIAGNOSIS — K219 Gastro-esophageal reflux disease without esophagitis: Secondary | ICD-10-CM | POA: Diagnosis not present

## 2015-05-30 DIAGNOSIS — I10 Essential (primary) hypertension: Secondary | ICD-10-CM | POA: Diagnosis not present

## 2015-05-30 DIAGNOSIS — R079 Chest pain, unspecified: Secondary | ICD-10-CM | POA: Diagnosis not present

## 2015-05-30 DIAGNOSIS — F329 Major depressive disorder, single episode, unspecified: Secondary | ICD-10-CM | POA: Diagnosis not present

## 2015-05-30 DIAGNOSIS — R0789 Other chest pain: Secondary | ICD-10-CM | POA: Diagnosis not present

## 2015-05-30 LAB — HEMOGLOBIN A1C
HEMOGLOBIN A1C: 6.6 % — AB (ref 4.8–5.6)
MEAN PLASMA GLUCOSE: 143 mg/dL

## 2015-06-11 DIAGNOSIS — M542 Cervicalgia: Secondary | ICD-10-CM | POA: Diagnosis not present

## 2015-06-11 DIAGNOSIS — M4722 Other spondylosis with radiculopathy, cervical region: Secondary | ICD-10-CM | POA: Diagnosis not present

## 2015-06-11 DIAGNOSIS — M5412 Radiculopathy, cervical region: Secondary | ICD-10-CM | POA: Diagnosis not present

## 2015-06-11 DIAGNOSIS — M5030 Other cervical disc degeneration, unspecified cervical region: Secondary | ICD-10-CM | POA: Diagnosis not present

## 2015-06-26 ENCOUNTER — Ambulatory Visit (INDEPENDENT_AMBULATORY_CARE_PROVIDER_SITE_OTHER): Payer: Medicare Other | Admitting: Cardiology

## 2015-06-26 ENCOUNTER — Encounter: Payer: Self-pay | Admitting: Cardiology

## 2015-06-26 VITALS — BP 128/80 | HR 68 | Ht 64.0 in | Wt 152.8 lb

## 2015-06-26 DIAGNOSIS — K219 Gastro-esophageal reflux disease without esophagitis: Secondary | ICD-10-CM | POA: Diagnosis not present

## 2015-06-26 DIAGNOSIS — I5189 Other ill-defined heart diseases: Secondary | ICD-10-CM

## 2015-06-26 DIAGNOSIS — I10 Essential (primary) hypertension: Secondary | ICD-10-CM | POA: Diagnosis not present

## 2015-06-26 DIAGNOSIS — Z0389 Encounter for observation for other suspected diseases and conditions ruled out: Secondary | ICD-10-CM | POA: Diagnosis not present

## 2015-06-26 DIAGNOSIS — R739 Hyperglycemia, unspecified: Secondary | ICD-10-CM

## 2015-06-26 DIAGNOSIS — Z8249 Family history of ischemic heart disease and other diseases of the circulatory system: Secondary | ICD-10-CM

## 2015-06-26 DIAGNOSIS — R7309 Other abnormal glucose: Secondary | ICD-10-CM

## 2015-06-26 DIAGNOSIS — I519 Heart disease, unspecified: Secondary | ICD-10-CM

## 2015-06-26 DIAGNOSIS — R0789 Other chest pain: Secondary | ICD-10-CM

## 2015-06-26 DIAGNOSIS — I639 Cerebral infarction, unspecified: Secondary | ICD-10-CM

## 2015-06-26 DIAGNOSIS — E785 Hyperlipidemia, unspecified: Secondary | ICD-10-CM

## 2015-06-26 DIAGNOSIS — IMO0001 Reserved for inherently not codable concepts without codable children: Secondary | ICD-10-CM | POA: Insufficient documentation

## 2015-06-26 NOTE — Assessment & Plan Note (Signed)
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

## 2015-06-26 NOTE — Assessment & Plan Note (Signed)
Treated

## 2015-06-26 NOTE — Assessment & Plan Note (Signed)
On statin Rx 

## 2015-06-26 NOTE — Progress Notes (Signed)
06/26/2015 Kendra Deleon   [birth year]  [id number]  Primary Physician Simona Huh, MD Primary Cardiologist: Dr Gwenlyn Found (new)  HPI:  Pleasant 64 y/o female from [place]. Her son is a PA and is currently working in [place].  The patient presented with chest pain and was seen by Dr Gwenlyn Found in consult 05/28/15. She has multiple cardiac risk factors including HTN, dyslipidemia, and a family history of CAD. A Myoview was which was abnormal and it was decided to proceed with coronary angiogram. Fortunatley this revealed normal coronaries. Echo showed preserved LVF with grade 2 diastolic dysfunction. She has no heart failure symptoms. Carotid dopplers showed 1-39% ICA stenosis. Her BS was 143, HgA1c-6.6. It was ultimately felt her symptoms were from radiculopathy, she is seeing Dr Rita Ohara about this. She is here for a post hospital check. She denies any further chest pain.    Current Outpatient Prescriptions  Medication Sig Dispense Refill  . Calcium Carbonate-Vitamin D (CALTRATE 600+D PO) Take 1 tablet by mouth daily.    . Cholecalciferol (VITAMIN D3) 2000 UNITS TABS Take 1 tablet by mouth daily.    . diclofenac (VOLTAREN) 75 MG EC tablet Take 1 tablet by mouth 2 (two) times daily with a meal.    . fenofibrate 160 MG tablet Take 160 mg by mouth daily.     Marland Kitchen FLUoxetine (PROZAC) 20 MG capsule Take 20 mg by mouth daily.     Marland Kitchen lisinopril-hydrochlorothiazide (PRINZIDE,ZESTORETIC) 20-12.5 MG per tablet Take 1 tablet by mouth daily.     . pantoprazole (PROTONIX) 40 MG tablet Take 40 mg by mouth daily.     . simvastatin (ZOCOR) 40 MG tablet Take 40 mg by mouth daily.      No current facility-administered medications for this visit.    No Known Allergies  Social History   Social History  . Marital Status: Single    Spouse Name: N/A  . Number of Children: N/A  . Years of Education: N/A   Occupational History  . Not on file.   Social History Main Topics  . Smoking status: Never  Smoker   . Smokeless tobacco: Not on file  . Alcohol Use: No  . Drug Use: No  . Sexual Activity: Not on file   Other Topics Concern  . Not on file   Social History Narrative     Review of Systems: General: negative for chills, fever, night sweats or weight changes.  Cardiovascular: negative for chest pain, dyspnea on exertion, edema, orthopnea, palpitations, paroxysmal nocturnal dyspnea or shortness of breath Dermatological: negative for rash Respiratory: negative for cough or wheezing Urologic: negative for hematuria Abdominal: negative for nausea, vomiting, diarrhea, bright red blood per rectum, melena, or hematemesis Neurologic: negative for visual changes, syncope, or dizziness All other systems reviewed and are otherwise negative except as noted above.    Blood pressure 128/80, pulse 68, height 5\' 4"  (1.626 m), weight 152 lb 12.8 oz (69.31 kg).  General appearance: alert, cooperative and no distress Extremities: Rt radial site without hematoma Skin: Skin color, texture, turgor normal. 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 She will f/u her HGb A1c with her PCP. We can see her in a year or sooner if needed. I suggested she consider taking ASA 81 mg with mild carotid disease by doppler.   Kerin Ransom K PA-C 06/26/2015 2:15 PM

## 2015-06-26 NOTE — Patient Instructions (Addendum)
Your physician wants you to follow-up in: 1 year or sooner if needed with Dr. Gwenlyn Found. You will receive a reminder letter in the mail two months in advance. If you don't receive a letter, please call our office to schedule the follow-up appointment.   If you need a refill on your cardiac medications before your next appointment, please call your pharmacy.

## 2015-06-26 NOTE — Assessment & Plan Note (Signed)
05/29/15 

## 2015-06-26 NOTE — Assessment & Plan Note (Signed)
With Hgb A1c of 6.6

## 2015-06-26 NOTE — Assessment & Plan Note (Signed)
Grade 2 with normal LVF

## 2015-06-26 NOTE — Assessment & Plan Note (Signed)
On PPI

## 2015-07-16 ENCOUNTER — Ambulatory Visit: Payer: Medicare Other | Admitting: Podiatry

## 2015-09-21 ENCOUNTER — Encounter: Payer: Self-pay | Admitting: Podiatry

## 2015-09-21 ENCOUNTER — Ambulatory Visit (INDEPENDENT_AMBULATORY_CARE_PROVIDER_SITE_OTHER): Payer: Medicare Other | Admitting: Podiatry

## 2015-09-21 VITALS — BP 132/74 | HR 61 | Resp 16

## 2015-09-21 DIAGNOSIS — B351 Tinea unguium: Secondary | ICD-10-CM | POA: Diagnosis not present

## 2015-09-21 DIAGNOSIS — M21612 Bunion of left foot: Secondary | ICD-10-CM

## 2015-09-21 MED ORDER — TERBINAFINE HCL 250 MG PO TABS: ORAL_TABLET | ORAL | Status: DC

## 2015-09-21 NOTE — Progress Notes (Signed)
Subjective:     Patient ID: Kendra Deleon, female   DOB: 1951/05/02, 65 y.o.   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 Also continued topical and I debrided the top of the nail

## 2015-09-26 DIAGNOSIS — Z6825 Body mass index (BMI) 25.0-25.9, adult: Secondary | ICD-10-CM | POA: Diagnosis not present

## 2015-09-26 DIAGNOSIS — I10 Essential (primary) hypertension: Secondary | ICD-10-CM | POA: Diagnosis not present

## 2015-09-26 DIAGNOSIS — M5030 Other cervical disc degeneration, unspecified cervical region: Secondary | ICD-10-CM | POA: Diagnosis not present

## 2015-09-26 DIAGNOSIS — M4722 Other spondylosis with radiculopathy, cervical region: Secondary | ICD-10-CM | POA: Diagnosis not present

## 2015-09-26 DIAGNOSIS — M542 Cervicalgia: Secondary | ICD-10-CM | POA: Diagnosis not present

## 2015-09-26 DIAGNOSIS — M5412 Radiculopathy, cervical region: Secondary | ICD-10-CM | POA: Diagnosis not present

## 2015-10-31 DIAGNOSIS — N183 Chronic kidney disease, stage 3 (moderate): Secondary | ICD-10-CM | POA: Diagnosis not present

## 2015-10-31 DIAGNOSIS — K219 Gastro-esophageal reflux disease without esophagitis: Secondary | ICD-10-CM | POA: Diagnosis not present

## 2015-10-31 DIAGNOSIS — E559 Vitamin D deficiency, unspecified: Secondary | ICD-10-CM | POA: Diagnosis not present

## 2015-10-31 DIAGNOSIS — I129 Hypertensive chronic kidney disease with stage 1 through stage 4 chronic kidney disease, or unspecified chronic kidney disease: Secondary | ICD-10-CM | POA: Diagnosis not present

## 2015-10-31 DIAGNOSIS — M5030 Other cervical disc degeneration, unspecified cervical region: Secondary | ICD-10-CM | POA: Diagnosis not present

## 2015-10-31 DIAGNOSIS — F411 Generalized anxiety disorder: Secondary | ICD-10-CM | POA: Diagnosis not present

## 2015-10-31 DIAGNOSIS — E7800 Pure hypercholesterolemia, unspecified: Secondary | ICD-10-CM | POA: Diagnosis not present

## 2015-10-31 DIAGNOSIS — M899 Disorder of bone, unspecified: Secondary | ICD-10-CM | POA: Diagnosis not present

## 2015-10-31 DIAGNOSIS — Z0000 Encounter for general adult medical examination without abnormal findings: Secondary | ICD-10-CM | POA: Diagnosis not present

## 2015-12-15 DIAGNOSIS — H2513 Age-related nuclear cataract, bilateral: Secondary | ICD-10-CM | POA: Diagnosis not present

## 2016-01-22 DIAGNOSIS — Z6826 Body mass index (BMI) 26.0-26.9, adult: Secondary | ICD-10-CM | POA: Diagnosis not present

## 2016-01-22 DIAGNOSIS — M4722 Other spondylosis with radiculopathy, cervical region: Secondary | ICD-10-CM | POA: Diagnosis not present

## 2016-01-22 DIAGNOSIS — M5030 Other cervical disc degeneration, unspecified cervical region: Secondary | ICD-10-CM | POA: Diagnosis not present

## 2016-01-22 DIAGNOSIS — M542 Cervicalgia: Secondary | ICD-10-CM | POA: Diagnosis not present

## 2016-01-22 DIAGNOSIS — M5412 Radiculopathy, cervical region: Secondary | ICD-10-CM | POA: Diagnosis not present

## 2016-04-30 DIAGNOSIS — Z803 Family history of malignant neoplasm of breast: Secondary | ICD-10-CM | POA: Diagnosis not present

## 2016-04-30 DIAGNOSIS — Z1231 Encounter for screening mammogram for malignant neoplasm of breast: Secondary | ICD-10-CM | POA: Diagnosis not present

## 2016-05-27 DIAGNOSIS — L570 Actinic keratosis: Secondary | ICD-10-CM | POA: Diagnosis not present

## 2016-05-27 DIAGNOSIS — D239 Other benign neoplasm of skin, unspecified: Secondary | ICD-10-CM | POA: Diagnosis not present

## 2016-05-27 DIAGNOSIS — L821 Other seborrheic keratosis: Secondary | ICD-10-CM | POA: Diagnosis not present

## 2016-06-01 DIAGNOSIS — Z23 Encounter for immunization: Secondary | ICD-10-CM | POA: Diagnosis not present

## 2016-06-25 ENCOUNTER — Ambulatory Visit (INDEPENDENT_AMBULATORY_CARE_PROVIDER_SITE_OTHER): Payer: Medicare Other | Admitting: Cardiovascular Disease

## 2016-06-25 ENCOUNTER — Encounter: Payer: Self-pay | Admitting: Cardiovascular Disease

## 2016-06-25 DIAGNOSIS — Z0389 Encounter for observation for other suspected diseases and conditions ruled out: Secondary | ICD-10-CM

## 2016-06-25 DIAGNOSIS — E785 Hyperlipidemia, unspecified: Secondary | ICD-10-CM

## 2016-06-25 DIAGNOSIS — IMO0001 Reserved for inherently not codable concepts without codable children: Secondary | ICD-10-CM

## 2016-06-25 DIAGNOSIS — I10 Essential (primary) hypertension: Secondary | ICD-10-CM | POA: Diagnosis not present

## 2016-06-25 NOTE — Progress Notes (Signed)
06/25/2016 Kendra Deleon   1950/09/10  [id number]  Primary Physician Simona Huh, MD Primary Cardiologist: Lorretta Harp MD Renae Gloss  HPI:  Kendra Deleon is a delightful 65 year old mildly overweight married Caucasian female mother of 38, grandmother of 5 grandchildren who is recently retired a year ago from working at [employer]. Her risk factors include treated hypertension, hyperlipidemia and family history with a father who died at age 75 of a myocardial infarction preoperatively. She has never had a heart attack or stroke. She does not smoke nor she diabetic. She had chest pain admission in September of last year with an intermediate risk Myoview stress test led to the radial left heart cath by Dr. Irish Lack revealing essentially normal coronary arteries and normal LV function. She has had no recurrent symptoms.   Current Outpatient Prescriptions  Medication Sig Dispense Refill  . Calcium Carbonate-Vitamin D (CALTRATE 600+D PO) Take 1 tablet by mouth daily.    . Cholecalciferol (VITAMIN D3) 2000 UNITS TABS Take 1 tablet by mouth daily.    . fenofibrate 160 MG tablet Take 160 mg by mouth daily.     Marland Kitchen FLUoxetine (PROZAC) 20 MG capsule Take 20 mg by mouth daily.     Marland Kitchen lisinopril-hydrochlorothiazide (PRINZIDE,ZESTORETIC) 20-12.5 MG per tablet Take 1 tablet by mouth daily.     . pantoprazole (PROTONIX) 40 MG tablet Take 40 mg by mouth daily.     . simvastatin (ZOCOR) 40 MG tablet Take 40 mg by mouth daily.      No current facility-administered medications for this visit.     No Known Allergies  Social History   Social History  . Marital status: Single    Spouse name: N/A  . Number of children: N/A  . Years of education: N/A   Occupational History  . Not on file.   Social History Main Topics  . Smoking status: Never Smoker  . Smokeless tobacco: Never Used  . Alcohol use No  . Drug use: No  . Sexual activity: Not on file   Other Topics Concern  .  Not on file   Social History Narrative  . No narrative on file     Review of Systems: General: negative for chills, fever, night sweats or weight changes.  Cardiovascular: negative for chest pain, dyspnea on exertion, edema, orthopnea, palpitations, paroxysmal nocturnal dyspnea or shortness of breath Dermatological: negative for rash Respiratory: negative for cough or wheezing Urologic: negative for hematuria Abdominal: negative for nausea, vomiting, diarrhea, bright red blood per rectum, melena, or hematemesis Neurologic: negative for visual changes, syncope, or dizziness All other systems reviewed and are otherwise negative except as noted above.    Blood pressure 124/76, pulse (!) 58, height 5\' 4"  (1.626 m), weight 149 lb (67.6 kg).  General appearance: alert and no distress Neck: no adenopathy, no carotid bruit, no JVD, supple, symmetrical, trachea midline and thyroid not enlarged, symmetric, no tenderness/mass/nodules Lungs: clear to auscultation bilaterally Heart: regular rate and rhythm, S1, S2 normal, no murmur, click, rub or gallop Extremities: extremities normal, atraumatic, no cyanosis or edema  EKG sinus bradycardia 58 without ST or T-wave changes. I personally reviewed this EKG  ASSESSMENT AND PLAN:   Dyslipidemia History of dyslipidemia on statin therapy and fenofibrate. Last lipid profile performed 05/29/15 revealed total cholesterol of 58, LDL 91 and HDL of 48. This is followed by her PCP.  Benign essential HTN History of hypertension blood pressure measured today at 124/76. She is on  lisinopril and hydrochlorothiazide. Continue current meds at current dosing  Normal coronary arteries History of cardiac catheterization performed by Dr. Irish Lack radially on 05/29/15 the setting of chest pain and an intermediate risk Myoview stress test. This showed essentially normal coronary arteries. She's had no recurrent symptoms since that time.      Lorretta Harp MD  FACP,FACC,FAHA, [facility] 06/25/2016 9:43 AM

## 2016-06-25 NOTE — Patient Instructions (Signed)
Medication Instructions: Your physician recommends that you continue on your current medications as directed. Please refer to the Current Medication list given to you today.   Follow-Up: Your physician recommends that you schedule a follow-up appointment as needed with Dr. Berry.  If you need a refill on your cardiac medications before your next appointment, please call your pharmacy.  

## 2016-06-25 NOTE — Assessment & Plan Note (Addendum)
History of dyslipidemia on statin therapy and fenofibrate. Last lipid profile performed 05/29/15 revealed total cholesterol of 58, LDL 91 and HDL of 48. This is followed by her PCP.

## 2016-06-25 NOTE — Assessment & Plan Note (Signed)
History of hypertension blood pressure measured today at 124/76. She is on lisinopril and hydrochlorothiazide. Continue current meds at current dosing

## 2016-06-25 NOTE — Assessment & Plan Note (Signed)
History of cardiac catheterization performed by Dr. Irish Lack radially on 05/29/15 the setting of chest pain and an intermediate risk Myoview stress test. This showed essentially normal coronary arteries. She's had no recurrent symptoms since that time.

## 2016-06-27 DIAGNOSIS — M4722 Other spondylosis with radiculopathy, cervical region: Secondary | ICD-10-CM | POA: Diagnosis not present

## 2016-06-27 DIAGNOSIS — M5030 Other cervical disc degeneration, unspecified cervical region: Secondary | ICD-10-CM | POA: Diagnosis not present

## 2016-06-27 DIAGNOSIS — M5412 Radiculopathy, cervical region: Secondary | ICD-10-CM | POA: Diagnosis not present

## 2016-06-27 DIAGNOSIS — M542 Cervicalgia: Secondary | ICD-10-CM | POA: Diagnosis not present

## 2016-07-05 DIAGNOSIS — M4722 Other spondylosis with radiculopathy, cervical region: Secondary | ICD-10-CM | POA: Diagnosis not present

## 2016-07-05 DIAGNOSIS — M4802 Spinal stenosis, cervical region: Secondary | ICD-10-CM | POA: Diagnosis not present

## 2016-07-07 DIAGNOSIS — M5412 Radiculopathy, cervical region: Secondary | ICD-10-CM | POA: Diagnosis not present

## 2016-07-07 DIAGNOSIS — M5030 Other cervical disc degeneration, unspecified cervical region: Secondary | ICD-10-CM | POA: Diagnosis not present

## 2016-07-07 DIAGNOSIS — M542 Cervicalgia: Secondary | ICD-10-CM | POA: Diagnosis not present

## 2016-07-07 DIAGNOSIS — M4722 Other spondylosis with radiculopathy, cervical region: Secondary | ICD-10-CM | POA: Diagnosis not present

## 2016-07-07 DIAGNOSIS — I10 Essential (primary) hypertension: Secondary | ICD-10-CM | POA: Diagnosis not present

## 2016-07-07 DIAGNOSIS — M5020 Other cervical disc displacement, unspecified cervical region: Secondary | ICD-10-CM | POA: Diagnosis not present

## 2016-07-07 DIAGNOSIS — Z6826 Body mass index (BMI) 26.0-26.9, adult: Secondary | ICD-10-CM | POA: Diagnosis not present

## 2016-07-09 ENCOUNTER — Telehealth (INDEPENDENT_AMBULATORY_CARE_PROVIDER_SITE_OTHER): Payer: Self-pay | Admitting: Orthopaedic Surgery

## 2016-07-09 NOTE — Telephone Encounter (Signed)
Pt just saw doctor Noodlemen and wants to do back surgery and would like to know if Dr. Tanda Rockers can call her and talk about this since they are friends. Cb# [phone]

## 2016-07-10 NOTE — Telephone Encounter (Signed)
FYI

## 2016-07-11 NOTE — Telephone Encounter (Signed)
Please call and relate that we cannot get MRI on our computer-have her get copy and bring to office at her convenience

## 2016-07-11 NOTE — Telephone Encounter (Signed)
Left VM for pt per PW note

## 2016-07-16 ENCOUNTER — Encounter: Payer: Self-pay | Admitting: Physical Therapy

## 2016-07-16 ENCOUNTER — Ambulatory Visit: Payer: Medicare Other | Attending: Neurosurgery | Admitting: Physical Therapy

## 2016-07-16 DIAGNOSIS — M5412 Radiculopathy, cervical region: Secondary | ICD-10-CM | POA: Diagnosis not present

## 2016-07-16 DIAGNOSIS — M62838 Other muscle spasm: Secondary | ICD-10-CM | POA: Diagnosis not present

## 2016-07-16 NOTE — Patient Instructions (Addendum)
Reading    When reading, hold material in tilted position and maintain good sitting posture.   Copyright  VHI. All rights reserved.  Sleeping on Side    Place pillow between knees. Use cervical support under neck and a roll around waist as needed. Have your head slightly bent to the right or not passed midline. Copyright  VHI. All rights reserved.  Posture - Sitting    Sit upright, head facing forward. Try using a roll to support lower back. Keep shoulders relaxed, and avoid rounded back. Keep hips level with knees. Avoid crossing legs for long periods.   Copyright  VHI. All rights reserved.  [facility] [address], [place], [place] [address] Phone # [phone] Fax [phone]

## 2016-07-16 NOTE — Therapy (Signed)
[facility] [address] [place], [place], [address] Phone: [phone]   Fax:  [phone]  Physical Therapy Evaluation  Patient Details  Name: Kendra Deleon MRN: [id number] Date of Birth: 1951/07/09 Referring Provider: Dr. Jovita Gamma  Encounter Date: 07/16/2016      PT End of Session - 07/16/16 1603    Visit Number 1   Number of Visits 10   Date for PT Re-Evaluation 08/13/16   Authorization Type medicare 10th visit g-code   PT Start Time [id number]   PT Stop Time 1603   PT Time Calculation (min) 33 min   Activity Tolerance Patient tolerated treatment well   Behavior During Therapy [facility] for tasks assessed/performed      Past Medical History:  Diagnosis Date  . Depression   . Hyperlipidemia   . Hypertension     Past Surgical History:  Procedure Laterality Date  . achilles tendon rupture Left   . BREAST REDUCTION SURGERY    . CARDIAC CATHETERIZATION N/A 05/29/2015   Procedure: Left Heart Cath and Coronary Angiography;  Surgeon: Jettie Booze, MD;  Location: [place] [facility];  Service: Cardiovascular;  Laterality: N/A;  . OOPHORECTOMY    . right knee menisectomy  Left     There were no vitals filed for this visit.       Subjective Assessment - 07/16/16 1534    Subjective Patient reports 12/30/2013 saw Dr. Sherwood Gambler for her neck. My neck pain returned 04/01/2016 and tingling with numbness in left arm 06/2016.    How long can you sit comfortably? long drives   How long can you stand comfortably? none   Patient Stated Goals reduce pain so she does not have surgery   Currently in Pain? 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 Jovita Gamma   Onset Date/Surgical Date 06/15/16   Hand Dominance Right   Prior Therapy None     Precautions   Precautions None     Restrictions   Weight Bearing Restrictions No     Balance Screen   Has the patient fallen in the past 6 months Yes   How many times? 1  slipped on urine and hit head   Has the patient had a decrease in activity level because of a fear of falling?  No   Is the patient reluctant to leave their home because of a fear of falling?  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 residence     Prior Function   Level of Independence Independent   Vocation Retired   Leisure walk dogs with both arms   now uses right     Cognition   Overall Cognitive Status Within Functional Limits for tasks assessed     Observation/Other Assessments   Focus on Therapeutic Outcomes (FOTO)  53% limitation  goal is 38% limitation     Posture/Postural Control   Posture/Postural Control No significant limitations     ROM / Strength   AROM / PROM / Strength AROM;Strength     AROM   AROM Assessment Site Cervical   Cervical Flexion 62   Cervical Extension 40  sharp pain   Cervical - Right Side Bend 45   Cervical - Left Side Bend 35  causes tingling in left arm   Cervical - Right Rotation 82   Cervical - Left Rotation 70  causes tingling in left arm     Strength   Overall Strength Comments bil. shoulder strength 5/5   Right/Left hand Right;Left   Right Hand Grip (lbs) 58   Left Hand Grip (lbs) 58     Palpation   Palpation comment palpation to left side of C4-5 causes tingling in left arm     Special Tests    Special Tests Cervical  left ulnar, brachial pelxus, radial + for neural tension   Cervical Tests Spurling's     Spurling's   Findings Positive    Side Left   Comment tingling     Distraction Test   Findngs Positive   side Left   Comment reduces tingling in left arm                           PT Education - 07/16/16 1604    Education provided Yes   Education Details body mechanics   Person(s) Educated Patient   Methods Explanation;Demonstration;Verbal cues;Handout   Comprehension Returned demonstration;Verbalized understanding             PT Long Term Goals - 07/16/16 1601      PT LONG TERM GOAL #1   Title independent with HEP   Time 4   Period Weeks   Status New     PT LONG TERM GOAL #2   Title sleep on side with min. difficulty   Time 4   Period Weeks   Status New     PT LONG TERM GOAL #3   Title walk dogs with 2 arms due to tingling in left arm decreased >/= 75%   Time 4   Period Weeks   Status New     PT LONG TERM GOAL #4   Title left arm falling asleep 75% less with driving   Time 4   Period Weeks   Status New     PT LONG TERM GOAL #5   Title holding her 20# grandson with minimal difficulty due to pain decreased   Time 4   Period Weeks   Status New               Plan - 07/16/16 1605    Clinical Impression Statement Patient is a 65 year old female with diagnosis of cervical pain.  Patient reports cervical pain started in May 2017 and left arm tingling and numbness started 4 weeks ago.  Patient reports minimal pain is 6/10 and worse is 10/10.  Patient has numbness and tingling in left arm with driving, turning and sidebending to the left.  Cervical distraction reduces pain. When left side of C4-5 is palpated there is tingling in left arm.  Cervical rotation and sidebending to the left is decreased.  Patient is of low complexity due to pain is evolving but no comorbidties that will affect care.  Patient will benefit with skilled PT to reduce cervical and left  arm pain.    Rehab Potential Excellent   Clinical Impairments Affecting Rehab Potential None   PT Frequency 2x /  week   PT Duration 4 weeks   PT Treatment/Interventions Cryotherapy;Electrical Stimulation;Ultrasound;Traction;Moist Heat;Therapeutic activities;Therapeutic exercise;Neuromuscular re-education;Patient/family education;Passive range of motion;Manual techniques;Dry needling   PT Next Visit Plan cervical traction; neural tension stretch to left arm; soft tissue work to cervical; modalities as needed   PT Home Exercise Plan neural tension stretch   Recommended Other Services None   Consulted and Agree with Plan of Care Patient      Patient will benefit from skilled therapeutic intervention in order to improve the following deficits and impairments:  Pain, Decreased mobility, Decreased strength, Decreased activity tolerance, Increased muscle spasms, Decreased range of motion  Visit Diagnosis: Radiculopathy, cervical region - Plan: PT plan of care cert/re-cert  Other muscle spasm - Plan: PT plan of care cert/re-cert      G-Codes - [id number]    Functional Assessment Tool Used FOTO score is 53% limitation  goal is 38% limitation   Functional Limitation Changing and maintaining body position   Changing and Maintaining Body Position Current Status [id number]) At least 40 percent but less than 60 percent impaired, limited or restricted   Changing and Maintaining Body Position Goal Status [id number]) At least 20 percent but less than 40 percent impaired, limited or restricted       Problem List Patient Active Problem List   Diagnosis Date Noted  . Normal coronary arteries 06/26/2015  . Elevated blood sugar 06/26/2015  . Family history of coronary artery disease 06/26/2015  . Diastolic dysfunction 123XX123  . Atypical chest pain   . Abnormal nuclear stress test   . Chest pain 05/28/2015  . Depression 05/28/2015  . Dyslipidemia 05/28/2015  . Benign essential HTN 05/28/2015  . Normocytic anemia 05/28/2015  . GERD (gastroesophageal reflux disease) 05/28/2015  . Paresthesia 05/28/2015  .  Headache   . Paresthesias     Earlie Counts, PT 07/16/16 4:15 PM   [facility] [address] [place], [place], [address] Phone: [phone]   Fax:  [phone]  Name: Kendra Deleon MRN: [id number] Date of Birth: [birth year]

## 2016-07-17 ENCOUNTER — Ambulatory Visit: Payer: Medicare Other | Admitting: Physical Therapy

## 2016-07-17 DIAGNOSIS — M62838 Other muscle spasm: Secondary | ICD-10-CM

## 2016-07-17 DIAGNOSIS — M5412 Radiculopathy, cervical region: Secondary | ICD-10-CM

## 2016-07-17 NOTE — Therapy (Signed)
[facility] [address] [place], [place], [address] Phone: [phone]   Fax:  [phone]  Physical Therapy Treatment  Patient Details  Name: Kendra Deleon MRN: [id number] Date of Birth: 09-17-1950 Referring Provider: Dr. Jovita Gamma  Encounter Date: 07/17/2016      PT End of Session - 07/17/16 1434    Visit Number 2   Number of Visits 10   Date for PT Re-Evaluation 08/13/16   Authorization Type medicare 10th visit g-code   PT Start Time 1400   PT Stop Time 1445   PT Time Calculation (min) 45 min   Activity Tolerance Patient tolerated treatment well      Past Medical History:  Diagnosis Date  . Depression   . Hyperlipidemia   . Hypertension     Past Surgical History:  Procedure Laterality Date  . achilles tendon rupture Left   . BREAST REDUCTION SURGERY    . CARDIAC CATHETERIZATION N/A 05/29/2015   Procedure: Left Heart Cath and Coronary Angiography;  Surgeon: Jettie Booze, MD;  Location: [place] [facility];  Service: Cardiovascular;  Laterality: N/A;  . OOPHORECTOMY    . right knee menisectomy  Left     There were no vitals filed for this visit.      Subjective Assessment - 07/17/16 1403    Subjective No change in symptoms.  It did help to modify sleeping position.  Night are worst symptoms.  Left distal UE heaviness and symptoms to finger tips.     Currently in Pain? Yes   Pain Score 5    Pain Location Neck   Pain Orientation Left   Pain Type Chronic pain                         [facility] Adult PT Treatment/Exercise - 07/17/16 0001      Posture/Postural Control   Posture Comments Discussion on sitting posture, using lumbar roll to align cervical spine; imagery for self cervical elongation; stoplight system      Neck Exercises: Seated   Other Seated Exercise thoracic extension with ball 10x; review of postural/body mechanics   Other Seated Exercise right only  sidebend 10x     Traction   Type of Traction Cervical   Min (lbs) 6   Max (lbs) 12   Hold Time 60 sec   Time 15 min                PT Education - 07/16/16 1604    Education provided Yes   Education Details Customer service manager) Educated Patient   Methods Explanation;Demonstration;Verbal cues;Handout   Comprehension Returned demonstration;Verbalized understanding             PT Long Term Goals - 07/17/16 1611      PT LONG TERM GOAL #1   Title independent with HEP   Time 4   Period Weeks   Status On-going     PT LONG TERM GOAL #2   Title sleep on side with min. difficulty   Time 4   Period Weeks   Status On-going     PT LONG TERM GOAL #3   Title walk dogs with 2 arms due to tingling in left arm decreased >/= 75%   Time 4   Period Weeks   Status On-going     PT LONG TERM GOAL #4   Title left arm falling asleep 75% less with driving  Time 4   Period Weeks   Status On-going     PT LONG TERM GOAL #5   Title holding her 20# grandson with minimal difficulty due to pain decreased   Time 4   Period Weeks   Status On-going               Plan - 07/17/16 1435    Clinical Impression Statement The patient is able to do right cervical sidebending and thoracic extension without an increase in pain or UE symptoms.  She is receptive to postural education and the"stoplight system" to avoid exacerbation of symptoms.   Following mechanical cervical traction, she reports feeling better.  Therapist closely monitoring response with all interventions.     PT Next Visit Plan cervical traction; neural tension stretch to left arm; soft tissue work to cervical; modalities as needed      Patient will benefit from skilled therapeutic intervention in order to improve the following deficits and impairments:     Visit Diagnosis: Radiculopathy, cervical region  Other muscle spasm     Problem List Patient Active Problem List   Diagnosis Date Noted  .  Normal coronary arteries 06/26/2015  . Elevated blood sugar 06/26/2015  . Family history of coronary artery disease 06/26/2015  . Diastolic dysfunction 123XX123  . Atypical chest pain   . Abnormal nuclear stress test   . Chest pain 05/28/2015  . Depression 05/28/2015  . Dyslipidemia 05/28/2015  . Benign essential HTN 05/28/2015  . Normocytic anemia 05/28/2015  . GERD (gastroesophageal reflux disease) 05/28/2015  . Paresthesia 05/28/2015  . Headache   . Paresthesias    Ruben Im, PT 07/17/16 4:27 PM Phone: [phone] Fax: [phone]  Alvera Singh 07/17/2016, 4:22 PM  [facility] [address] [place], [place], [address] Phone: [phone]   Fax:  [phone]  Name: Kendra Deleon MRN: [id number] Date of Birth: [birth year]

## 2016-07-21 ENCOUNTER — Encounter: Payer: Self-pay | Admitting: Physical Therapy

## 2016-07-21 ENCOUNTER — Ambulatory Visit: Payer: Medicare Other | Admitting: Physical Therapy

## 2016-07-21 DIAGNOSIS — M62838 Other muscle spasm: Secondary | ICD-10-CM | POA: Diagnosis not present

## 2016-07-21 DIAGNOSIS — M5412 Radiculopathy, cervical region: Secondary | ICD-10-CM

## 2016-07-21 NOTE — Therapy (Signed)
[facility] [address] [place], [place], [address] Phone: [phone]   Fax:  [phone]  Physical Therapy Treatment  Patient Details  Name: Kendra Deleon MRN: [id number] Date of Birth: October 05, 1950 Referring Provider: Dr. Jovita Gamma  Encounter Date: 07/21/2016      PT End of Session - 07/21/16 1404    Visit Number 3   Number of Visits 10   Date for PT Re-Evaluation 08/13/16   Authorization Type medicare 10th visit g-code   PT Start Time [id number]   PT Stop Time 1502   PT Time Calculation (min) 60 min   Activity Tolerance Patient tolerated treatment well   Behavior During Therapy [facility] for tasks assessed/performed      Past Medical History:  Diagnosis Date  . Depression   . Hyperlipidemia   . Hypertension     Past Surgical History:  Procedure Laterality Date  . achilles tendon rupture Left   . BREAST REDUCTION SURGERY    . CARDIAC CATHETERIZATION N/A 05/29/2015   Procedure: Left Heart Cath and Coronary Angiography;  Surgeon: Jettie Booze, MD;  Location: [place] [facility];  Service: Cardiovascular;  Laterality: N/A;  . OOPHORECTOMY    . right knee menisectomy  Left     There were no vitals filed for this visit.      Subjective Assessment - 07/21/16 1406    Subjective Back to where it was and shoulder is hurting more today.  States she was away this weekend and had a not so comfortable mattress.  States she did have much less pain after previous treatment   Currently in Pain? Yes   Pain Score 7    Pain Location Neck   Pain Orientation Left   Pain Descriptors / Indicators Aching   Pain Type Chronic pain   Pain Radiating Towards down left arm   Pain Onset More than a month ago   Multiple Pain Sites No                         [facility] Adult PT Treatment/Exercise - 07/21/16 0001      Traction   Type of Traction Cervical   Min (lbs) 6   Max (lbs) 12   Hold Time 60 sec   Time 15 min     Manual Therapy   Manual Therapy Soft tissue mobilization;Myofascial release;Joint mobilization   Joint Mobilization A/P glenohumeral mobs grade II-III   Soft tissue mobilization suboccipitals, cervical parasinals, scalenes, pecs, anterior deltiod   Myofascial Release suboccipitals                     PT Long Term Goals - 07/17/16 1611      PT LONG TERM GOAL #1   Title independent with HEP   Time 4   Period Weeks   Status On-going     PT LONG TERM GOAL #2   Title sleep on side with min. difficulty   Time 4   Period Weeks   Status On-going     PT LONG TERM GOAL #3   Title walk dogs with 2 arms due to tingling in left arm decreased >/= 75%   Time 4   Period Weeks   Status On-going     PT LONG TERM GOAL #4   Title left arm falling asleep 75% less with driving   Time 4   Period Weeks   Status On-going  PT LONG TERM GOAL #5   Title holding her 20# grandson with minimal difficulty due to pain decreased   Time 4   Period Weeks   Status On-going               Plan - 07/21/16 2139    Clinical Impression Statement Pt presented with symptoms radiating down Lt arm but responded well to joint mobilizations and soft tissue release.  Reviewed posture with patient today but did not progress exercises due to muscle spams.  Pt will benefit from skilled PT to improve strength and endurance of shoulder and posture stability in order to prevent increased muscle spasms when patient is needing to perform functional tasks while sitting and standing.     Rehab Potential Excellent   Clinical Impairments Affecting Rehab Potential None   PT Frequency 2x / week   PT Duration 4 weeks   PT Treatment/Interventions Cryotherapy;Electrical Stimulation;Ultrasound;Traction;Moist Heat;Therapeutic activities;Therapeutic exercise;Neuromuscular re-education;Patient/family education;Passive range of motion;Manual techniques;Dry needling   PT Next Visit Plan cervical  traction; neural tension stretch to left arm; soft tissue work to cervical; mid/low trap strengthening   PT Home Exercise Plan neural tension stretch   Consulted and Agree with Plan of Care Patient      Patient will benefit from skilled therapeutic intervention in order to improve the following deficits and impairments:  Pain, Decreased mobility, Decreased strength, Decreased activity tolerance, Increased muscle spasms, Decreased range of motion  Visit Diagnosis: Radiculopathy, cervical region  Other muscle spasm     Problem List Patient Active Problem List   Diagnosis Date Noted  . Normal coronary arteries 06/26/2015  . Elevated blood sugar 06/26/2015  . Family history of coronary artery disease 06/26/2015  . Diastolic dysfunction 123XX123  . Atypical chest pain   . Abnormal nuclear stress test   . Chest pain 05/28/2015  . Depression 05/28/2015  . Dyslipidemia 05/28/2015  . Benign essential HTN 05/28/2015  . Normocytic anemia 05/28/2015  . GERD (gastroesophageal reflux disease) 05/28/2015  . Paresthesia 05/28/2015  . Headache   . Paresthesias     Zannie Cove, PT 07/21/2016, 9:46 PM  [facility] [address], [place], [place], [address] Phone: [phone]   Fax:  [phone]  Name: Kendra Deleon MRN: [id number] Date of Birth: Nov 02, 1950

## 2016-07-28 ENCOUNTER — Ambulatory Visit: Payer: Medicare Other | Admitting: Physical Therapy

## 2016-07-28 ENCOUNTER — Encounter: Payer: Self-pay | Admitting: Physical Therapy

## 2016-07-28 DIAGNOSIS — M5412 Radiculopathy, cervical region: Secondary | ICD-10-CM | POA: Diagnosis not present

## 2016-07-28 DIAGNOSIS — M62838 Other muscle spasm: Secondary | ICD-10-CM

## 2016-07-28 NOTE — Patient Instructions (Addendum)
NECK TENSION: Assisted Stretch    Reach right arm around head and hold slightly above ear. Gently bring right ear toward right shoulder. Hold position for 30 seconds.  Repeat _1__ times, alternating arms. Do _3__ times per day.  Copyright  VHI. All rights reserved.

## 2016-07-28 NOTE — Therapy (Signed)
[facility] [address] [place], [place], [address] Phone: [phone]   Fax:  [phone]  Physical Therapy Treatment  Patient Details  Name: Kendra Deleon MRN: [id number] Date of Birth: 11-18-1950 Referring Provider: Dr. Jovita Gamma  Encounter Date: 07/28/2016      PT End of Session - 07/28/16 1402    Visit Number 4   Number of Visits 10   Date for PT Re-Evaluation 08/13/16   Authorization Type medicare 10th visit g-code   PT Start Time [id number]   PT Stop Time 1455   PT Time Calculation (min) 53 min   Activity Tolerance Patient tolerated treatment well   Behavior During Therapy [facility] for tasks assessed/performed      Past Medical History:  Diagnosis Date  . Depression   . Hyperlipidemia   . Hypertension     Past Surgical History:  Procedure Laterality Date  . achilles tendon rupture Left   . BREAST REDUCTION SURGERY    . CARDIAC CATHETERIZATION N/A 05/29/2015   Procedure: Left Heart Cath and Coronary Angiography;  Surgeon: Jettie Booze, MD;  Location: [place] [facility];  Service: Cardiovascular;  Laterality: N/A;  . OOPHORECTOMY    . right knee menisectomy  Left     There were no vitals filed for this visit.      Subjective Assessment - 07/28/16 1404    Subjective Pt states she was in a lot of pain after previous treatment.  Reports she feels like it was the soft tissue mobilization that made her very sore and she is still sore down into her Lt shoulder and pain was radiating down the back of her arm.   Pain Score 9    Pain Location Neck   Pain Orientation Left   Pain Descriptors / Indicators Aching   Pain Relieving Factors heat, rolling on the ball   Multiple Pain Sites No                         [facility] Adult PT Treatment/Exercise - 07/28/16 0001      Neck Exercises: Seated   Neck Retraction 5 reps  supine   Cervical Rotation Both;10 reps  holding for 10 sec   Lateral Flexion Right;5 reps  10 sec hold     Shoulder Exercises: Standing   Horizontal ABduction Strengthening;Both;10 reps  5 sec hold   Theraband Level (Shoulder Horizontal ABduction) Level 1 (Yellow)   External Rotation Strengthening;Both;10 reps  5 sec hold, one at a time   Theraband Level (Shoulder External Rotation) Level 1 (Yellow)   Extension Strengthening;Both;10 reps  5 sec hold   Row Strengthening;Both;10 reps  5 sec hold, red theraband   Theraband Level (Shoulder Row) Level 2 (Red)     Shoulder Exercises: Stretch   Other Shoulder Stretches upper trap stretch  5 sec holds     Traction   Type of Traction Cervical   Min (lbs) 6   Max (lbs) 12   Hold Time 60 sec   Time 15 min                PT Education - 07/28/16 1524    Education provided Yes   Education Details upper trap stretch   Person(s) Educated Patient   Methods Explanation;Demonstration;Handout   Comprehension Verbalized understanding;Returned demonstration             PT Long Term Goals - 07/28/16 1657  PT LONG TERM GOAL #1   Title independent with HEP   Time 4   Period Weeks   Status On-going               Plan - 07/28/16 1526    Clinical Impression Statement Pt was worred about pain being increased with therapy today, so focused on gentle strengthening and stretching.  Did not do soft tissue mobs due to patient feeling very sore from previous session.  Pt continues to demonstrate increased upper trap use to stabilize scapula.  Pt will continue to need skilled PT in order to improve muscle coordination druring functional movements for improved posture and reduce pain.   Rehab Potential Excellent   Clinical Impairments Affecting Rehab Potential None   PT Frequency 2x / week   PT Duration 4 weeks   PT Treatment/Interventions Cryotherapy;Electrical Stimulation;Ultrasound;Traction;Moist Heat;Therapeutic activities;Therapeutic exercise;Neuromuscular  re-education;Patient/family education;Passive range of motion;Manual techniques;Dry needling   PT Next Visit Plan cervical traction; mid/low trap strengthening progress as tolerated, review upper trap stretch, add levator stretch   PT Home Exercise Plan add theraband exercise as tolerated   Consulted and Agree with Plan of Care Patient      Patient will benefit from skilled therapeutic intervention in order to improve the following deficits and impairments:  Pain, Decreased mobility, Decreased strength, Decreased activity tolerance, Increased muscle spasms, Decreased range of motion  Visit Diagnosis: Radiculopathy, cervical region  Other muscle spasm     Problem List Patient Active Problem List   Diagnosis Date Noted  . Normal coronary arteries 06/26/2015  . Elevated blood sugar 06/26/2015  . Family history of coronary artery disease 06/26/2015  . Diastolic dysfunction 123XX123  . Atypical chest pain   . Abnormal nuclear stress test   . Chest pain 05/28/2015  . Depression 05/28/2015  . Dyslipidemia 05/28/2015  . Benign essential HTN 05/28/2015  . Normocytic anemia 05/28/2015  . GERD (gastroesophageal reflux disease) 05/28/2015  . Paresthesia 05/28/2015  . Headache   . Paresthesias     Zannie Cove, PT 07/28/2016, 5:00 PM  [facility] [address], [place], [place], [address] Phone: [phone]   Fax:  [phone]  Name: Kendra Deleon MRN: [id number] Date of Birth: 1950-10-23

## 2016-07-30 ENCOUNTER — Ambulatory Visit: Payer: Medicare Other | Admitting: Physical Therapy

## 2016-07-30 ENCOUNTER — Encounter: Payer: Self-pay | Admitting: Physical Therapy

## 2016-07-30 DIAGNOSIS — M62838 Other muscle spasm: Secondary | ICD-10-CM

## 2016-07-30 DIAGNOSIS — M5412 Radiculopathy, cervical region: Secondary | ICD-10-CM | POA: Diagnosis not present

## 2016-07-30 NOTE — Patient Instructions (Signed)
Resistive Band Rowing    With resistive band anchored in door, grasp both ends. Keeping elbows bent, pull back, squeezing shoulder blades together. Hold ____ seconds. Repeat ____ times. Do ____ sessions per day.  http://gt2.exer.us/98   Copyright  VHI. All rights reserved.  Lat Pull Down    Face anchor with knees slightly flexed. Palms down, pull arms down to sides. Repeat __ times per set. Do __ sets per session. Do __ sessions per week. Anchor Height: Over Head  http://tub.exer.us/90   Copyright  VHI. All rights reserved.  Mikle Bosworth, PTA 07/30/16 3:24 PM  [facility] [address] [place], [place] [address] Phone # [phone] Fax [phone]

## 2016-07-30 NOTE — Therapy (Signed)
[facility] [address] [place], [place], [address] Phone: [phone]   Fax:  [phone]  Physical Therapy Treatment  Patient Details  Name: Kendra Deleon MRN: [id number] Date of Birth: Aug 27, 1951 Referring Provider: Dr. Jovita Gamma  Encounter Date: 07/30/2016      PT End of Session - 07/30/16 1502    Visit Number 5   Number of Visits 10   Date for PT Re-Evaluation 08/13/16   Authorization Type medicare 10th visit g-code   PT Start Time 1450   PT Stop Time 1540   PT Time Calculation (min) 50 min   Activity Tolerance Patient tolerated treatment well   Behavior During Therapy [facility] for tasks assessed/performed      Past Medical History:  Diagnosis Date  . Depression   . Hyperlipidemia   . Hypertension     Past Surgical History:  Procedure Laterality Date  . achilles tendon rupture Left   . BREAST REDUCTION SURGERY    . CARDIAC CATHETERIZATION N/A 05/29/2015   Procedure: Left Heart Cath and Coronary Angiography;  Surgeon: Jettie Booze, MD;  Location: [place] [facility];  Service: Cardiovascular;  Laterality: N/A;  . OOPHORECTOMY    . right knee menisectomy  Left     There were no vitals filed for this visit.      Subjective Assessment - 07/30/16 1454    Subjective Pt reports neck feeling ebtter today than last time but continues to have pain through out day limiting comfort   How long can you sit comfortably? long drives   How long can you stand comfortably? none   Patient Stated Goals reduce pain so she does not have surgery   Currently in Pain? Yes   Pain Score 5    Pain Location Neck   Pain Orientation Left   Pain Descriptors / Indicators Aching   Pain Type Chronic pain   Pain Radiating Towards down left arm   Pain Onset More than a month ago   Pain Frequency Intermittent   Aggravating Factors  going to bed, waking up   Pain Relieving Factors heat, rolling on ball                          [facility] Adult PT Treatment/Exercise - 07/30/16 0001      Shoulder Exercises: Prone   Retraction Strengthening;Both;10 reps  Shoulder extension with head lift   Horizontal ABduction 1 Strengthening;Both;20 reps  arms only     Shoulder Exercises: Standing   Horizontal ABduction Strengthening;Both;10 reps  5 sec hold   Theraband Level (Shoulder Horizontal ABduction) Level 1 (Yellow)   External Rotation Strengthening;Both;10 reps  5 sec hold, one at a time   Theraband Level (Shoulder External Rotation) Level 2 (Red)   Extension Strengthening;Both;20 reps  #15 Lt arm only   Row Strengthening;Both;10 reps  #15     Traction   Type of Traction Cervical   Min (lbs) 5   Max (lbs) 20   Hold Time 60 sec   Time 15 min                PT Education - 07/30/16 1525    Education provided Yes   Education Details upper back strengthening   Person(s) Educated Patient   Methods Explanation;Demonstration;Handout   Comprehension Verbalized understanding             PT Long Term Goals - 07/30/16 1505  PT LONG TERM GOAL #1   Title independent with HEP   Time 4   Period Weeks   Status On-going     PT LONG TERM GOAL #2   Title sleep on side with min. difficulty   Time 4   Period Weeks   Status On-going     PT LONG TERM GOAL #3   Title walk dogs with 2 arms due to tingling in left arm decreased >/= 75%   Time 4   Period Weeks   Status On-going     PT LONG TERM GOAL #4   Title left arm falling asleep 75% less with driving   Time 4   Period Weeks   Status On-going     PT LONG TERM GOAL #5   Title holding her 20# grandson with minimal difficulty due to pain decreased   Time 4   Period Weeks   Status On-going               Plan - 07/30/16 1529    Clinical Impression Statement Pt presents with Lt shoulder pain and radiating neck pain. Bil hands tingling today. Pt had a lot of relief with cervical traction. Pt able  to tolerate all standing exercises well. Had some increase in tension with prone and supine positioned exercises after a few reps. Pt stated she is never able to lay flat in any position comfortably. Pt will continue to benefit from skilled therapy for postural training and neck strengthening.    Rehab Potential Excellent   Clinical Impairments Affecting Rehab Potential None   PT Frequency 2x / week   PT Duration 4 weeks   PT Treatment/Interventions Cryotherapy;Electrical Stimulation;Ultrasound;Traction;Moist Heat;Therapeutic activities;Therapeutic exercise;Neuromuscular re-education;Patient/family education;Passive range of motion;Manual techniques;Dry needling   PT Next Visit Plan cervical traction; mid/low trap strengthening progress as tolerated   Consulted and Agree with Plan of Care Patient      Patient will benefit from skilled therapeutic intervention in order to improve the following deficits and impairments:  Pain, Decreased mobility, Decreased strength, Decreased activity tolerance, Increased muscle spasms, Decreased range of motion  Visit Diagnosis: Radiculopathy, cervical region  Other muscle spasm     Problem List Patient Active Problem List   Diagnosis Date Noted  . Normal coronary arteries 06/26/2015  . Elevated blood sugar 06/26/2015  . Family history of coronary artery disease 06/26/2015  . Diastolic dysfunction 123XX123  . Atypical chest pain   . Abnormal nuclear stress test   . Chest pain 05/28/2015  . Depression 05/28/2015  . Dyslipidemia 05/28/2015  . Benign essential HTN 05/28/2015  . Normocytic anemia 05/28/2015  . GERD (gastroesophageal reflux disease) 05/28/2015  . Paresthesia 05/28/2015  . Headache   . Paresthesias     Mikle Bosworth PTA 07/30/2016, 3:57 PM   [facility] [address], [place], [place], [address] Phone: [phone]   Fax:  [phone]  Name: Kendra Deleon MRN: [id number] Date of Birth: [birth year]

## 2016-08-04 ENCOUNTER — Ambulatory Visit: Payer: Medicare Other | Attending: Neurosurgery | Admitting: Physical Therapy

## 2016-08-04 ENCOUNTER — Encounter: Payer: Self-pay | Admitting: Physical Therapy

## 2016-08-04 DIAGNOSIS — M5412 Radiculopathy, cervical region: Secondary | ICD-10-CM | POA: Diagnosis not present

## 2016-08-04 DIAGNOSIS — M62838 Other muscle spasm: Secondary | ICD-10-CM

## 2016-08-04 NOTE — Therapy (Signed)
[facility] [address], [place], [place], [address] Phone: [phone]   Fax:  [phone]  Physical Therapy Treatment  Patient Details  Name: Kendra Deleon MRN: [id number] Date of Birth: September 09, 1950 Referring Provider: Dr. Jovita Gamma  Encounter Date: 08/04/2016      PT End of Session - 08/04/16 1519    Visit Number 6   Number of Visits 10   Date for PT Re-Evaluation 08/13/16   Authorization Type medicare 10th visit g-code   PT Start Time [id number]   PT Stop Time 1535   PT Time Calculation (min) 46 min   Activity Tolerance Patient tolerated treatment well   Behavior During Therapy [facility] for tasks assessed/performed      Past Medical History:  Diagnosis Date  . Depression   . Hyperlipidemia   . Hypertension     Past Surgical History:  Procedure Laterality Date  . achilles tendon rupture Left   . BREAST REDUCTION SURGERY    . CARDIAC CATHETERIZATION N/A 05/29/2015   Procedure: Left Heart Cath and Coronary Angiography;  Surgeon: Jettie Booze, MD;  Location: [place] [facility];  Service: Cardiovascular;  Laterality: N/A;  . OOPHORECTOMY    . right knee menisectomy  Left     There were no vitals filed for this visit.      Subjective Assessment - 08/04/16 1459    Subjective Patient reports feeling better.  "just a tiny bit on the right side"   How long can you sit comfortably? as the day progresses the length of time gets less, but feels better   Patient Stated Goals reduce pain so she does not have surgery   Currently in Pain? Yes   Pain Score 4    Pain Location Neck   Pain Orientation Right   Pain Descriptors / Indicators Aching   Pain Radiating Towards a little tingling in Rt arm and a little discomfort in Lt shoulder blade   Pain Frequency Intermittent   Aggravating Factors  sitting long periods   Pain Relieving Factors exercises, heat, rolling the ball   Multiple Pain Sites No                          [facility] Adult PT Treatment/Exercise - 08/04/16 0001      Neck Exercises: Seated   Other Seated Exercise capital flexion - head nods in supine - 20x     Shoulder Exercises: Prone   Retraction Strengthening;Both;10 reps  Shoulder extension with head lift   Horizontal ABduction 1 Strengthening;Both;20 reps;Theraband  arms only   Theraband Level (Shoulder Horizontal ABduction 1) Level 1 (Yellow)   Other Prone Exercises D2 shoulder both ways yellow - 15x     Shoulder Exercises: Standing   Horizontal ABduction Strengthening;Both;10 reps  5 sec hold   Theraband Level (Shoulder Horizontal ABduction) Level 2 (Red)   External Rotation Strengthening;Both;10 reps   Theraband Level (Shoulder External Rotation) Level 2 (Red)   Extension Strengthening;Both;20 reps;Theraband   Theraband Level (Shoulder Extension) Level 2 (Red)   Row Strengthening;Both;10 reps;Theraband  #15   Theraband Level (Shoulder Row) Level 3 (Green)   Shoulder Elevation Limitations wall push up 20x     Traction   Type of Traction Cervical   Min (lbs) 5   Max (lbs) 20   Hold Time 60 sec   Time 15 min  PT Long Term Goals - 08/04/16 1658      PT LONG TERM GOAL #1   Title independent with HEP   Time 4   Period Weeks   Status On-going     PT LONG TERM GOAL #2   Title sleep on side with min. difficulty   Time 4   Period Weeks   Status On-going     PT LONG TERM GOAL #3   Title walk dogs with 2 arms due to tingling in left arm decreased >/= 75%   Time 4   Period Weeks   Status On-going     PT LONG TERM GOAL #4   Title left arm falling asleep 75% less with driving   Time 4   Period Weeks   Status On-going     PT LONG TERM GOAL #5   Title holding her 20# grandson with minimal difficulty due to pain decreased   Time 4   Period Weeks   Status On-going               Plan - 08/04/16 1520    Clinical Impression Statement Pt  reports feeling a lot better with exercises and cervical traction. Pt continues to need cueing during exericses in order to control scapula and had improved symptom response with postural cues.  Pt cont to need skilled PT for postural strengthening and neck strengthening.   Rehab Potential Excellent   Clinical Impairments Affecting Rehab Potential None   PT Frequency 2x / week   PT Duration 4 weeks   PT Treatment/Interventions Cryotherapy;Electrical Stimulation;Ultrasound;Traction;Moist Heat;Therapeutic activities;Therapeutic exercise;Neuromuscular re-education;Patient/family education;Passive range of motion;Manual techniques;Dry needling   PT Next Visit Plan cervical traction; mid/low trap strengthening progress as tolerated, cervical retraction   PT Home Exercise Plan progress as needed   Consulted and Agree with Plan of Care Patient      Patient will benefit from skilled therapeutic intervention in order to improve the following deficits and impairments:  Pain, Decreased mobility, Decreased strength, Decreased activity tolerance, Increased muscle spasms, Decreased range of motion  Visit Diagnosis: Radiculopathy, cervical region  Other muscle spasm     Problem List Patient Active Problem List   Diagnosis Date Noted  . Normal coronary arteries 06/26/2015  . Elevated blood sugar 06/26/2015  . Family history of coronary artery disease 06/26/2015  . Diastolic dysfunction 123XX123  . Atypical chest pain   . Abnormal nuclear stress test   . Chest pain 05/28/2015  . Depression 05/28/2015  . Dyslipidemia 05/28/2015  . Benign essential HTN 05/28/2015  . Normocytic anemia 05/28/2015  . GERD (gastroesophageal reflux disease) 05/28/2015  . Paresthesia 05/28/2015  . Headache   . Paresthesias     Kendra Deleon, PT 08/04/2016, 5:11 PM  [facility] [address], [place], [place], [address] Phone: [phone]   Fax:   [phone]  Name: Kendra Deleon MRN: [id number] Date of Birth: 1951-08-26

## 2016-08-06 ENCOUNTER — Ambulatory Visit: Payer: Medicare Other | Admitting: Physical Therapy

## 2016-08-06 DIAGNOSIS — M5412 Radiculopathy, cervical region: Secondary | ICD-10-CM

## 2016-08-06 DIAGNOSIS — M62838 Other muscle spasm: Secondary | ICD-10-CM

## 2016-08-06 NOTE — Therapy (Signed)
[facility] [address] [place], [place], [address] Phone: [phone]   Fax:  [phone]  Physical Therapy Treatment  Patient Details  Name: Kendra Deleon MRN: [id number] Date of Birth: 02/21/1951 Referring Provider: Dr. Jovita Gamma  Encounter Date: 08/06/2016      PT End of Session - 08/06/16 1444    Visit Number 7   Number of Visits 10   Date for PT Re-Evaluation 08/13/16   Authorization Type medicare 10th visit g-code   PT Start Time 1440   PT Stop Time 1528   PT Time Calculation (min) 48 min   Activity Tolerance Patient tolerated treatment well   Behavior During Therapy [facility] for tasks assessed/performed      Past Medical History:  Diagnosis Date  . Depression   . Hyperlipidemia   . Hypertension     Past Surgical History:  Procedure Laterality Date  . achilles tendon rupture Left   . BREAST REDUCTION SURGERY    . CARDIAC CATHETERIZATION N/A 05/29/2015   Procedure: Left Heart Cath and Coronary Angiography;  Surgeon: Jettie Booze, MD;  Location: [place] [facility];  Service: Cardiovascular;  Laterality: N/A;  . OOPHORECTOMY    . right knee menisectomy  Left     There were no vitals filed for this visit.      Subjective Assessment - 08/06/16 1443    Subjective Pt reports neck doing well, less tingling in hands. Lt shoulder fatigue at end of day usually.    How long can you sit comfortably? as the day progresses the length of time gets less, but feels better   How long can you stand comfortably? none   Patient Stated Goals reduce pain so she does not have surgery   Currently in Pain? Yes   Pain Score 2    Pain Location Neck   Pain Orientation Right   Pain Descriptors / Indicators Aching   Pain Type Chronic pain   Pain Onset More than a month ago   Pain Frequency Intermittent                         [facility] Adult PT Treatment/Exercise - 08/06/16 0001      Neck  Exercises: Seated   Other Seated Exercise --     Shoulder Exercises: Prone   Flexion Strengthening;Left;15 reps   Extension Strengthening;Left;15 reps   Horizontal ABduction 1 Strengthening;Left;15 reps     Shoulder Exercises: Standing   External Rotation Strengthening;Both;10 reps   Theraband Level (Shoulder External Rotation) Level 3 (Green)   Extension Strengthening;Both;20 reps;Theraband  #10   Row Strengthening;Both;20 reps  #20   Retraction Strengthening;Both;20 reps     Traction   Type of Traction Cervical   Min (lbs) 5   Max (lbs) 20   Hold Time 60 sec   Time 15 min                     PT Long Term Goals - 08/06/16 1446      PT LONG TERM GOAL #2   Title sleep on side with min. difficulty   Time 4   Period Weeks   Status On-going     PT LONG TERM GOAL #3   Title walk dogs with 2 arms due to tingling in left arm decreased >/= 75%   Time 4   Period Weeks   Status On-going     PT LONG TERM  GOAL #4   Title left arm falling asleep 75% less with driving   Time 4   Period Weeks   Status On-going     PT LONG TERM GOAL #5   Title holding her 20# grandson with minimal difficulty due to pain decreased   Time 4   Period Weeks   Status On-going               Plan - 08/06/16 1519    Clinical Impression Statement Pt able to tolerate all exercises well. Reports less tingling in Bil hands by about 20%. Pt has increased pain with internal rotation and some with exeternal rotation rediating up to neck. Pt will continue to benefit from skilled therapy for Bil neck stability and postural training.    Rehab Potential Excellent   Clinical Impairments Affecting Rehab Potential None   PT Frequency 2x / week   PT Duration 4 weeks   PT Treatment/Interventions Cryotherapy;Electrical Stimulation;Ultrasound;Traction;Moist Heat;Therapeutic activities;Therapeutic exercise;Neuromuscular re-education;Patient/family education;Passive range of motion;Manual  techniques;Dry needling   PT Next Visit Plan cervical traction; mid/low trap strengthening progress as tolerated, cervical retraction   Consulted and Agree with Plan of Care Patient      Patient will benefit from skilled therapeutic intervention in order to improve the following deficits and impairments:  Pain, Decreased mobility, Decreased strength, Decreased activity tolerance, Increased muscle spasms, Decreased range of motion  Visit Diagnosis: Radiculopathy, cervical region  Other muscle spasm     Problem List Patient Active Problem List   Diagnosis Date Noted  . Normal coronary arteries 06/26/2015  . Elevated blood sugar 06/26/2015  . Family history of coronary artery disease 06/26/2015  . Diastolic dysfunction 123XX123  . Atypical chest pain   . Abnormal nuclear stress test   . Chest pain 05/28/2015  . Depression 05/28/2015  . Dyslipidemia 05/28/2015  . Benign essential HTN 05/28/2015  . Normocytic anemia 05/28/2015  . GERD (gastroesophageal reflux disease) 05/28/2015  . Paresthesia 05/28/2015  . Headache   . Paresthesias     Mikle Bosworth PTA 08/06/2016, 3:41 PM  [facility] [address] [place], [place], [address] Phone: [phone]   Fax:  [phone]  Name: Kendra Deleon MRN: [id number] Date of Birth: 12-Oct-1950

## 2016-08-11 ENCOUNTER — Encounter: Payer: BLUE CROSS/BLUE SHIELD | Admitting: Physical Therapy

## 2016-08-13 ENCOUNTER — Ambulatory Visit: Payer: Medicare Other | Admitting: Physical Therapy

## 2016-08-13 ENCOUNTER — Encounter: Payer: Self-pay | Admitting: Physical Therapy

## 2016-08-13 DIAGNOSIS — M62838 Other muscle spasm: Secondary | ICD-10-CM | POA: Diagnosis not present

## 2016-08-13 DIAGNOSIS — M5412 Radiculopathy, cervical region: Secondary | ICD-10-CM

## 2016-08-13 NOTE — Therapy (Signed)
[facility] [address] [place], [place], [address] Phone: [phone]   Fax:  [phone]  Physical Therapy Treatment  Patient Details  Name: Kendra Deleon MRN: [id number] Date of Birth: 1950-09-03 Referring Provider: Dr. Jovita Gamma  Encounter Date: 08/13/2016      PT End of Session - 08/13/16 1508    Visit Number 8   Number of Visits 18   Date for PT Re-Evaluation 10/08/16   Authorization Type medicare 10th visit g-code   PT Start Time 1448   PT Stop Time 1540   PT Time Calculation (min) 52 min   Activity Tolerance Patient tolerated treatment well   Behavior During Therapy [facility] for tasks assessed/performed      Past Medical History:  Diagnosis Date  . Depression   . Hyperlipidemia   . Hypertension     Past Surgical History:  Procedure Laterality Date  . achilles tendon rupture Left   . BREAST REDUCTION SURGERY    . CARDIAC CATHETERIZATION N/A 05/29/2015   Procedure: Left Heart Cath and Coronary Angiography;  Surgeon: Jettie Booze, MD;  Location: [place] [facility];  Service: Cardiovascular;  Laterality: N/A;  . OOPHORECTOMY    . right knee menisectomy  Left     There were no vitals filed for this visit.      Subjective Assessment - 08/13/16 1453    Subjective Pt reports she is not feeling any pain today and as long as she is not tired, she states there hasn't been much pain.  States she just starts to feel pain when she gets tired and fatigued at the end of the    How long can you sit comfortably? as the day progresses the length of time gets less, but feels better   Currently in Pain? Yes   Pain Score 1    Pain Location Neck   Pain Orientation Right   Pain Descriptors / Indicators Aching   Pain Type Chronic pain   Pain Onset More than a month ago                         [facility] Adult PT Treatment/Exercise - 08/13/16 0001      Shoulder Exercises: Standing   External Rotation Strengthening;Both;10 reps   Theraband Level (Shoulder External Rotation) Level 3 (Green)   Extension Strengthening;Both;20 reps;Theraband  #10   Row Strengthening;Both;15 reps  #20 - 2 sets   Shoulder Elevation Limitations wall push up on ball 20x     Shoulder Exercises: Power [employer] 15 reps  upright row - 15#     Moist Heat Therapy   Number Minutes Moist Heat 15 Minutes   Moist Heat Location Cervical     Traction   Type of Traction Cervical   Min (lbs) 5   Max (lbs) 20   Hold Time 60 sec   Time 15 min                     PT Long Term Goals - 08/13/16 1510      PT LONG TERM GOAL #1   Title independent with HEP   Time 8   Period Weeks   Status On-going     PT LONG TERM GOAL #2   Title sleep on side with min. difficulty   Baseline  50% improved   Time 8   Period Weeks   Status On-going  PT LONG TERM GOAL #3   Title walk dogs with 2 arms due to tingling in left arm decreased >/= 75%   Baseline hurts when using the Lt arm   Time 8   Period Weeks   Status On-going     PT LONG TERM GOAL #4   Title left arm falling asleep 75% less with driving   Baseline [id number] improved   Time 8   Period Weeks   Status On-going     PT LONG TERM GOAL #5   Title holding her 20# grandson with minimal difficulty due to pain decreased   Baseline 30% improved   Time 8   Period Weeks   Status On-going               Plan - 08-31-2016 1552    Clinical Impression Statement Pt has made significant progress with the reduction of pain down to little or no pain at times.  Pt has improved FOTO score to 49% limitation and reports she feels about 50% improvement overall.  Pt continues to present with low endurance of postural muscles and has difficulty maintaining good postural alignment as the day progresses.  Pt will continue to benefit from skilled PT at this time in order to progress strengthening so patient will maintain decreased pain and be able  to perfrom funcitonal activities.  Skilled PT needed to return patient to walking her dogs and picking up her grandchild while her maintaining correct postural alignment for reduced risk of injury and to avoid surgery.   Rehab Potential Excellent   Clinical Impairments Affecting Rehab Potential None   PT Frequency 1x / week   PT Duration 8 weeks   PT Treatment/Interventions Cryotherapy;Electrical Stimulation;Ultrasound;Traction;Moist Heat;Therapeutic activities;Therapeutic exercise;Neuromuscular re-education;Patient/family education;Passive range of motion;Manual techniques;Dry needling   PT Next Visit Plan endurance with postural strengthening, cervical traction   PT Home Exercise Plan progress as needed   Consulted and Agree with Plan of Care Patient      Patient will benefit from skilled therapeutic intervention in order to improve the following deficits and impairments:  Pain, Decreased mobility, Decreased strength, Decreased activity tolerance, Increased muscle spasms, Decreased range of motion  Visit Diagnosis: Radiculopathy, cervical region - Plan: PT plan of care cert/re-cert  Other muscle spasm - Plan: PT plan of care cert/re-cert       G-Codes - 2016-08-31 1737    Functional Assessment Tool Used FOTO 49% limitation, clinical impression   Functional Limitation Changing and maintaining body position   Changing and Maintaining Body Position Current Status [id number]) At least 40 percent but less than 60 percent impaired, limited or restricted   Changing and Maintaining Body Position Goal Status [id number]) At least 20 percent but less than 40 percent impaired, limited or restricted      Problem List Patient Active Problem List   Diagnosis Date Noted  . Normal coronary arteries 06/26/2015  . Elevated blood sugar 06/26/2015  . Family history of coronary artery disease 06/26/2015  . Diastolic dysfunction 123XX123  . Atypical chest pain   . Abnormal nuclear stress test   . Chest  pain 05/28/2015  . Depression 05/28/2015  . Dyslipidemia 05/28/2015  . Benign essential HTN 05/28/2015  . Normocytic anemia 05/28/2015  . GERD (gastroesophageal reflux disease) 05/28/2015  . Paresthesia 05/28/2015  . Headache   . Paresthesias     Zannie Cove, PT August 31, 2016, 5:46 PM  [facility] [address] [place], [place], [address] Phone: [phone]  Fax:  [phone]  Name: Kendra Deleon MRN: [id number] Date of Birth: 1951/03/19

## 2016-08-18 DIAGNOSIS — Z6826 Body mass index (BMI) 26.0-26.9, adult: Secondary | ICD-10-CM | POA: Diagnosis not present

## 2016-08-18 DIAGNOSIS — M5030 Other cervical disc degeneration, unspecified cervical region: Secondary | ICD-10-CM | POA: Diagnosis not present

## 2016-08-18 DIAGNOSIS — M542 Cervicalgia: Secondary | ICD-10-CM | POA: Diagnosis not present

## 2016-08-18 DIAGNOSIS — M4722 Other spondylosis with radiculopathy, cervical region: Secondary | ICD-10-CM | POA: Diagnosis not present

## 2016-08-18 DIAGNOSIS — M5412 Radiculopathy, cervical region: Secondary | ICD-10-CM | POA: Diagnosis not present

## 2016-08-18 DIAGNOSIS — M5020 Other cervical disc displacement, unspecified cervical region: Secondary | ICD-10-CM | POA: Diagnosis not present

## 2016-08-18 DIAGNOSIS — I10 Essential (primary) hypertension: Secondary | ICD-10-CM | POA: Diagnosis not present

## 2016-08-19 ENCOUNTER — Encounter: Payer: BLUE CROSS/BLUE SHIELD | Admitting: Physical Therapy

## 2016-08-19 DIAGNOSIS — M8589 Other specified disorders of bone density and structure, multiple sites: Secondary | ICD-10-CM | POA: Diagnosis not present

## 2016-08-20 ENCOUNTER — Encounter: Payer: Self-pay | Admitting: Physical Therapy

## 2016-08-20 ENCOUNTER — Ambulatory Visit: Payer: Medicare Other | Admitting: Physical Therapy

## 2016-08-20 DIAGNOSIS — M5412 Radiculopathy, cervical region: Secondary | ICD-10-CM | POA: Diagnosis not present

## 2016-08-20 DIAGNOSIS — M62838 Other muscle spasm: Secondary | ICD-10-CM | POA: Diagnosis not present

## 2016-08-20 NOTE — Therapy (Signed)
[facility] [address], [place], [place], [address] Phone: [phone]   Fax:  [phone]  Physical Therapy Treatment  Patient Details  Name: Kendra Deleon MRN: [id number] Date of Birth: 01-23-1951 Referring Provider: Dr. Jovita Gamma  Encounter Date: 08/20/2016      PT End of Session - 08/20/16 1147    Visit Number 9   Number of Visits 18   Date for PT Re-Evaluation 10/08/16   Authorization Type medicare 18th visit g-code   PT Start Time 1102   PT Stop Time 1152   PT Time Calculation (min) 50 min   Activity Tolerance Patient tolerated treatment well   Behavior During Therapy [facility] for tasks assessed/performed      Past Medical History:  Diagnosis Date  . Depression   . Hyperlipidemia   . Hypertension     Past Surgical History:  Procedure Laterality Date  . achilles tendon rupture Left   . BREAST REDUCTION SURGERY    . CARDIAC CATHETERIZATION N/A 05/29/2015   Procedure: Left Heart Cath and Coronary Angiography;  Surgeon: Jettie Booze, MD;  Location: [facility];  Service: Cardiovascular;  Laterality: N/A;  . OOPHORECTOMY    . right knee menisectomy  Left     There were no vitals filed for this visit.      Subjective Assessment - 08/20/16 1148    Subjective Pt states she is having some pain in she Lt shoulder blade, but not pain just feels like muscle tightness and also in the Lt forearm.  Reports no pain, numbness or tingling in neck or arm.   Currently in Pain? No/denies                         [facility] Adult PT Treatment/Exercise - 08/20/16 0001      Shoulder Exercises: Prone   Other Prone Exercises W's, Y's, T's - 2x10 head on towel roll     Shoulder Exercises: Standing   Other Standing Exercises AROM strengthening - 2lb - I's, Y's, T's to shoulder height - 2x10     Shoulder Exercises: ROM/Strengthening   UBE (Upper Arm Bike) backwards 7 min     Shoulder  Exercises: Stretch   Other Shoulder Stretches upper trap stretch - 3x20sec hold     Shoulder Exercises: Power Development worker, community 20 reps  20# sitting on physioball   Row 20 reps  20# sitting on physioball   Other Power [employer] Exercises Ws  20# sitting on physioball     Moist Heat Therapy   Number Minutes Moist Heat 15 Minutes   Moist Heat Location Cervical     Traction   Type of Traction Cervical   Min (lbs) 5   Max (lbs) 20   Hold Time 60 sec   Rest Time 20 sec   Time 15 min                     PT Long Term Goals - 08/20/16 1150      PT LONG TERM GOAL #1   Title independent with HEP   Baseline still adding to final HEP   Time 8   Period Weeks   Status On-going     PT LONG TERM GOAL #2   Title sleep on side with min. difficulty   Baseline improved but still has to be aware of position when she wakes up   Time 8  Period Weeks   Status On-going     PT LONG TERM GOAL #5   Title holding her 20# grandson with minimal difficulty due to pain decreased   Baseline 30% improved - reports picking things up with better posture helps   Time 8   Period Weeks   Status On-going               Plan - 08/20/16 1152    Clinical Impression Statement Pt had reduced muscle tightness with exercises today.  Pt able to maintain good posture until she began to fatigue towards the end of treatment.  Pt able to progress exercises and tolerated increased resistance.  Skilled PT needed for continued strength and endurance in order to perform functional tasks without increased pain or risk of further injury.   Rehab Potential Excellent   Clinical Impairments Affecting Rehab Potential None   PT Frequency 1x / week   PT Duration 8 weeks   PT Treatment/Interventions Cryotherapy;Electrical Stimulation;Ultrasound;Traction;Moist Heat;Therapeutic activities;Therapeutic exercise;Neuromuscular re-education;Patient/family education;Passive range of motion;Manual techniques;Dry  needling   PT Next Visit Plan endurance with postural strengthening, cervical traction, shoulder stabilization in quadruped if tolerated   PT Home Exercise Plan progress as needed   Consulted and Agree with Plan of Care Patient      Patient will benefit from skilled therapeutic intervention in order to improve the following deficits and impairments:  Pain, Decreased mobility, Decreased strength, Decreased activity tolerance, Increased muscle spasms, Decreased range of motion  Visit Diagnosis: Radiculopathy, cervical region  Other muscle spasm     Problem List Patient Active Problem List   Diagnosis Date Noted  . Normal coronary arteries 06/26/2015  . Elevated blood sugar 06/26/2015  . Family history of coronary artery disease 06/26/2015  . Diastolic dysfunction 123XX123  . Atypical chest pain   . Abnormal nuclear stress test   . Chest pain 05/28/2015  . Depression 05/28/2015  . Dyslipidemia 05/28/2015  . Benign essential HTN 05/28/2015  . Normocytic anemia 05/28/2015  . GERD (gastroesophageal reflux disease) 05/28/2015  . Paresthesia 05/28/2015  . Headache   . Paresthesias     Zannie Cove , PT 08/20/2016, 12:03 PM  [facility] [address] [place], [place], [address] Phone: [phone]   Fax:  [phone]  Name: Kendra Deleon MRN: [id number] Date of Birth: 06-25-1951

## 2016-08-21 ENCOUNTER — Ambulatory Visit: Payer: Medicare Other | Admitting: Physical Therapy

## 2016-08-21 ENCOUNTER — Encounter: Payer: Self-pay | Admitting: Physical Therapy

## 2016-08-21 DIAGNOSIS — M62838 Other muscle spasm: Secondary | ICD-10-CM | POA: Diagnosis not present

## 2016-08-21 DIAGNOSIS — M5412 Radiculopathy, cervical region: Secondary | ICD-10-CM

## 2016-08-21 NOTE — Therapy (Signed)
[facility] [address], [place], [place], [address] Phone: [phone]   Fax:  [phone]  Physical Therapy Treatment  Patient Details  Name: Kendra Deleon MRN: [id number] Date of Birth: [birth year] Referring Provider: Dr. Jovita Gamma  Encounter Date: 08/21/2016      PT End of Session - 08/21/16 1152    Visit Number 10   Number of Visits 18   Date for PT Re-Evaluation 10/08/16   Authorization Type medicare 18th visit g-code   PT Start Time 1140   PT Stop Time 1238   PT Time Calculation (min) 58 min   Activity Tolerance Patient tolerated treatment well   Behavior During Therapy [facility] for tasks assessed/performed      Past Medical History:  Diagnosis Date  . Depression   . Hyperlipidemia   . Hypertension     Past Surgical History:  Procedure Laterality Date  . achilles tendon rupture Left   . BREAST REDUCTION SURGERY    . CARDIAC CATHETERIZATION N/A 05/29/2015   Procedure: Left Heart Cath and Coronary Angiography;  Surgeon: Jettie Booze, MD;  Location: [facility];  Service: Cardiovascular;  Laterality: N/A;  . OOPHORECTOMY    . right knee menisectomy  Left     There were no vitals filed for this visit.      Subjective Assessment - 08/21/16 1148    Subjective Pt reports her shoulder hurting in shoulder joint and along upper trap. A little pain in forarm.    Currently in Pain? Yes   Pain Score 8    Pain Location Neck   Pain Orientation Right   Pain Descriptors / Indicators Aching   Pain Type Chronic pain   Pain Onset More than a month ago   Pain Frequency Intermittent                         [facility] Adult PT Treatment/Exercise - 08/21/16 0001      Shoulder Exercises: Prone   Other Prone Exercises Prone extension     Shoulder Exercises: ROM/Strengthening   UBE (Upper Arm Bike) 3x3  therapist present to discuss treatment     Shoulder Exercises: Power Tower    Extension 20 reps  Red band single arm   Retraction 20 reps  red band single arm   Other Power [employer] Exercises --     Moist Heat Therapy   Number Minutes Moist Heat 15 Minutes   Moist Heat Location Cervical     Traction   Type of Traction Cervical   Min (lbs) 5   Max (lbs) 20   Hold Time 60 sec   Rest Time 20 sec   Time 15 min     Manual Therapy   Manual Therapy Soft tissue mobilization   Manual therapy comments Pt seated   Soft tissue mobilization Lt upper trap and scalenes                     PT Long Term Goals - 08/20/16 1150      PT LONG TERM GOAL #1   Title independent with HEP   Baseline still adding to final HEP   Time 8   Period Weeks   Status On-going     PT LONG TERM GOAL #2   Title sleep on side with min. difficulty   Baseline improved but still has to be aware of position when she wakes up  Time 8   Period Weeks   Status On-going     PT LONG TERM GOAL #5   Title holding her 20# grandson with minimal difficulty due to pain decreased   Baseline 30% improved - reports picking things up with better posture helps   Time 8   Period Weeks   Status On-going               Plan - 08/21/16 1245    Clinical Impression Statement Pt presents with pain in Lt shoulder with tingleing. Pt had increased tingling in Lt arm with standing and prone exercises. Pt had multiple trigger points in Lt upper trap and scalenes that released some with manual. Pt has most pain relief with cervical traction. Discussed with patient dry needling and will discuss with other therapist about possibly adding this to patient treatment. Pt will continue to benefit from skilled therapy for cervical stability and managment of pain symptoms.    Rehab Potential Excellent   Clinical Impairments Affecting Rehab Potential None   PT Frequency 1x / week   PT Duration 8 weeks   PT Treatment/Interventions Cryotherapy;Electrical Stimulation;Ultrasound;Traction;Moist  Heat;Therapeutic activities;Therapeutic exercise;Neuromuscular re-education;Patient/family education;Passive range of motion;Manual techniques;Dry needling   PT Next Visit Plan Dry needling if able, manual massage as needed, cervical traction   Consulted and Agree with Plan of Care Patient      Patient will benefit from skilled therapeutic intervention in order to improve the following deficits and impairments:  Pain, Decreased mobility, Decreased strength, Decreased activity tolerance, Increased muscle spasms, Decreased range of motion  Visit Diagnosis: Radiculopathy, cervical region  Other muscle spasm     Problem List Patient Active Problem List   Diagnosis Date Noted  . Normal coronary arteries 06/26/2015  . Elevated blood sugar 06/26/2015  . Family history of coronary artery disease 06/26/2015  . Diastolic dysfunction 123XX123  . Atypical chest pain   . Abnormal nuclear stress test   . Chest pain 05/28/2015  . Depression 05/28/2015  . Dyslipidemia 05/28/2015  . Benign essential HTN 05/28/2015  . Normocytic anemia 05/28/2015  . GERD (gastroesophageal reflux disease) 05/28/2015  . Paresthesia 05/28/2015  . Headache   . Paresthesias     Mikle Bosworth PTA 08/21/2016, 1:31 PM  [facility] [address] [place], [place], [address] Phone: [phone]   Fax:  [phone]  Name: Kendra Deleon MRN: [id number] Date of Birth: 05-06-1951

## 2016-08-27 ENCOUNTER — Encounter: Payer: BLUE CROSS/BLUE SHIELD | Admitting: Physical Therapy

## 2016-08-28 ENCOUNTER — Encounter: Payer: Self-pay | Admitting: Physical Therapy

## 2016-08-28 ENCOUNTER — Ambulatory Visit: Payer: Medicare Other | Admitting: Physical Therapy

## 2016-08-28 DIAGNOSIS — M62838 Other muscle spasm: Secondary | ICD-10-CM | POA: Diagnosis not present

## 2016-08-28 DIAGNOSIS — M5412 Radiculopathy, cervical region: Secondary | ICD-10-CM

## 2016-08-28 NOTE — Therapy (Signed)
[facility] [address] [place], [place], [address] Phone: [phone]   Fax:  [phone]  Physical Therapy Treatment  Patient Details  Name: Kendra Deleon MRN: [id number] Date of Birth: February 01, 1951 Referring Provider: Dr. Jovita Gamma  Encounter Date: 08/28/2016      PT End of Session - 08/28/16 1238    Visit Number 11   Number of Visits 18   Date for PT Re-Evaluation 10/08/16   Authorization Type medicare 18th visit g-code   PT Start Time 1234   PT Stop Time 1328   PT Time Calculation (min) 54 min   Activity Tolerance Patient tolerated treatment well   Behavior During Therapy [facility] for tasks assessed/performed      Past Medical History:  Diagnosis Date  . Depression   . Hyperlipidemia   . Hypertension     Past Surgical History:  Procedure Laterality Date  . achilles tendon rupture Left   . BREAST REDUCTION SURGERY    . CARDIAC CATHETERIZATION N/A 05/29/2015   Procedure: Left Heart Cath and Coronary Angiography;  Surgeon: Jettie Booze, MD;  Location: [facility];  Service: Cardiovascular;  Laterality: N/A;  . OOPHORECTOMY    . right knee menisectomy  Left     There were no vitals filed for this visit.      Subjective Assessment - 08/28/16 1238    Subjective Pt states yesterday and today started having more neck pain.  Reports she did not do anything differnetly that would have effected it.     Currently in Pain? Yes   Pain Score 5    Pain Location Neck   Pain Orientation Left   Pain Descriptors / Indicators Aching   Pain Type Chronic pain   Pain Onset More than a month ago   Pain Frequency Intermittent   Aggravating Factors  unsure   Multiple Pain Sites No                         [facility] Adult PT Treatment/Exercise - 08/28/16 0001      Neck Exercises: Seated   Cervical Isometrics --  cervical retration 10x 5 sec hold in supine   Other Seated Exercise  Upper trap stretch - 3x20sec   Other Seated Exercise capital flexion 10x no lift, then 10x with lift     Shoulder Exercises: Seated   Other Seated Exercises shoulder rolls up, down and back - 10x     Shoulder Exercises: Standing   External Rotation Strengthening;Both;10 reps   Theraband Level (Shoulder External Rotation) Level 3 (Green)     Shoulder Exercises: ROM/Strengthening   UBE (Upper Arm Bike) back 3 min, forward 5 min     Moist Heat Therapy   Number Minutes Moist Heat 15 Minutes   Moist Heat Location Cervical     Traction   Type of Traction Cervical   Min (lbs) 5   Max (lbs) 20   Hold Time 60 sec   Rest Time 20 sec   Time 15 min     Manual Therapy   Manual Therapy Soft tissue mobilization   Manual therapy comments Pt seated   Soft tissue mobilization Lt upper trap and rhomboids                PT Education - 08/28/16 1318    Education provided Yes   Education Details HEP cervical head nod and retraction   Person(s) Educated Patient  Methods Explanation;Verbal cues;Handout   Comprehension Verbalized understanding             PT Long Term Goals - 08/28/16 1251      PT LONG TERM GOAL #1   Title independent with HEP   Baseline still adding to final HEP   Time 8   Period Weeks   Status On-going     PT LONG TERM GOAL #2   Title sleep on side with min. difficulty   Baseline improved but still has to be aware of position when she wakes up   Time 8   Period Weeks   Status On-going     PT LONG TERM GOAL #3   Title walk dogs with 2 arms due to tingling in left arm decreased >/= 75%   Baseline hurts when using the Lt arm   Time 8   Period Weeks   Status On-going               Plan - 08/28/16 1320    Clinical Impression Statement Pt able to perform exercises, but increased symptoms today.  Pt very tender to palpation with light pressure on trigger points in upper traps and rhomboids.  Symptoms reduced with cervical retractions.  Pt had  difficultly with capital flexion and lifting against gravity.  Skilled PT needed for deep neck flexor strengtheing and postural strength in order to perform functional tasks with reduded pain.   Rehab Potential Excellent   Clinical Impairments Affecting Rehab Potential None   PT Frequency 1x / week   PT Duration 8 weeks   PT Treatment/Interventions Cryotherapy;Electrical Stimulation;Ultrasound;Traction;Moist Heat;Therapeutic activities;Therapeutic exercise;Neuromuscular re-education;Patient/family education;Passive range of motion;Manual techniques;Dry needling   PT Next Visit Plan May want to try dry needling, manual massage as needed, cervical traction   PT Home Exercise Plan progress as needed   Consulted and Agree with Plan of Care Patient      Patient will benefit from skilled therapeutic intervention in order to improve the following deficits and impairments:  Pain, Decreased mobility, Decreased strength, Decreased activity tolerance, Increased muscle spasms, Decreased range of motion  Visit Diagnosis: Radiculopathy, cervical region  Other muscle spasm     Problem List Patient Active Problem List   Diagnosis Date Noted  . Normal coronary arteries 06/26/2015  . Elevated blood sugar 06/26/2015  . Family history of coronary artery disease 06/26/2015  . Diastolic dysfunction 123XX123  . Atypical chest pain   . Abnormal nuclear stress test   . Chest pain 05/28/2015  . Depression 05/28/2015  . Dyslipidemia 05/28/2015  . Benign essential HTN 05/28/2015  . Normocytic anemia 05/28/2015  . GERD (gastroesophageal reflux disease) 05/28/2015  . Paresthesia 05/28/2015  . Headache   . Paresthesias     Zannie Cove , PT 08/28/2016, 1:31 PM  [facility] [address] [place], [place], [address] Phone: [phone]   Fax:  [phone]  Name: Kendra Deleon MRN: [id number] Date of Birth: 05-May-1951

## 2016-08-28 NOTE — Patient Instructions (Signed)
Nod: Cervical Flexion    Nod head, tipping chin down. Tighten muscles in the back of throat.   Do lying on your back  Do _10__ times, _1__ times per day.  http://ss.exer.us/191   Copyright  VHI. All rights reserved.  Neck: Retraction    Sit with back and head straight. Pull chin back to line up ear with shoulder. Do not turn or tilt head.  Do lying on your back pushing head into the pillow May assist if child cannot keep correct position. Hold _5___ seconds. Repeat __10__ times. Do ____ sessions per day. CAUTION: Movement should be gentle, steady and slow.  Copyright  VHI. All rights reserved.

## 2016-09-02 ENCOUNTER — Encounter: Payer: BLUE CROSS/BLUE SHIELD | Admitting: Physical Therapy

## 2016-09-04 ENCOUNTER — Ambulatory Visit: Payer: Medicare Other | Attending: Neurosurgery | Admitting: Physical Therapy

## 2016-09-04 ENCOUNTER — Encounter: Payer: Self-pay | Admitting: Physical Therapy

## 2016-09-04 DIAGNOSIS — M5412 Radiculopathy, cervical region: Secondary | ICD-10-CM | POA: Insufficient documentation

## 2016-09-04 DIAGNOSIS — M62838 Other muscle spasm: Secondary | ICD-10-CM

## 2016-09-04 NOTE — Therapy (Signed)
[facility] [address] [place], [place], [address] Phone: [phone]   Fax:  [phone]  Physical Therapy Treatment  Patient Details  Name: Kendra Deleon MRN: [id number] Date of Birth: [birth year] Referring Provider: Dr. Jovita Gamma  Encounter Date: 09/04/2016      PT End of Session - 09/04/16 1400    Visit Number 12   Number of Visits 18   Date for PT Re-Evaluation 10/08/16   Authorization Type medicare 18th visit g-code   PT Start Time 1400   PT Stop Time 1453   PT Time Calculation (min) 53 min   Activity Tolerance Patient tolerated treatment well   Behavior During Therapy [facility] for tasks assessed/performed      Past Medical History:  Diagnosis Date  . Depression   . Hyperlipidemia   . Hypertension     Past Surgical History:  Procedure Laterality Date  . achilles tendon rupture Left   . BREAST REDUCTION SURGERY    . CARDIAC CATHETERIZATION N/A 05/29/2015   Procedure: Left Heart Cath and Coronary Angiography;  Surgeon: Jettie Booze, MD;  Location: [facility];  Service: Cardiovascular;  Laterality: N/A;  . OOPHORECTOMY    . right knee menisectomy  Left     There were no vitals filed for this visit.      Subjective Assessment - 09/04/16 1406    Subjective Pt states her arm and neck are hurting today and she could not lean back on the sink when getting her hair cut.  States it was easier for her to carry her grandson, but picking him up was hard.   Currently in Pain? Yes   Pain Score 7    Pain Location Neck   Pain Orientation Left   Pain Descriptors / Indicators Aching   Pain Type Chronic pain   Pain Onset More than a month ago   Pain Frequency Intermittent   Aggravating Factors  unsure   Pain Relieving Factors exercises, heat, rolling the ball on bacck                         [facility] Adult PT Treatment/Exercise - 09/04/16 0001      Shoulder Exercises:  Standing   Horizontal ABduction Strengthening;Right;Left;20 reps;Theraband   Theraband Level (Shoulder Horizontal ABduction) Level 3 (Green)   External Rotation Strengthening;Both;Theraband;10 reps   Theraband Level (Shoulder External Rotation) Level 3 (Green)   Internal Rotation Strengthening;Right;Left;20 reps;Theraband   Theraband Level (Shoulder Internal Rotation) Level 3 (Green)   Extension Strengthening;20 reps;Theraband;Right;Left   Theraband Level (Shoulder Extension) Level 3 (Green)   Row Strengthening;Both;20 reps;Theraband   Theraband Level (Shoulder Row) Level 3 (Green)     Shoulder Exercises: ROM/Strengthening   UBE (Upper Arm Bike) back 3 min, forward 3 min     Shoulder Exercises: Stretch   Other Shoulder Stretches upper trap stretch - 5x 10sec hold     Shoulder Exercises: Power Tower   Extension 20 reps  Red band single arm   Retraction 20 reps  red band single arm     Moist Heat Therapy   Number Minutes Moist Heat 15 Minutes   Moist Heat Location Cervical     Traction   Type of Traction Cervical   Min (lbs) 5   Max (lbs) 20   Hold Time 60 sec   Rest Time 20 sec   Time 15 min     Manual Therapy   Manual  Therapy Soft tissue mobilization   Manual therapy comments Pt seated   Soft tissue mobilization Lt upper trap and rhomboids                     PT Long Term Goals - 09/04/16 1413      PT LONG TERM GOAL #2   Title sleep on side with min. difficulty   Baseline still having a hard time sleeping some nights   Time 8   Period Weeks   Status On-going     PT LONG TERM GOAL #5   Title holding her 20# grandson with minimal difficulty due to pain decreased   Baseline improved               Plan - 09/04/16 1419    Clinical Impression Statement Pt continues to have muscle spasms and got relief from manual to upper trap.  Performs single arm exercises with the band with good core control.  Had difficulty with horizontal abduction and  could only complete 10 reps with good form.  Shoulder internal rotation increased tingling on Lt and pressing on scalenses increases tingling down arm.  Pt also has palpable tenderness at supraspinatus attachment.  Skilled PT needed to progress sholder strength to return to functional lifting.   Rehab Potential Excellent   Clinical Impairments Affecting Rehab Potential None   PT Frequency 1x / week   PT Duration 8 weeks   PT Treatment/Interventions Cryotherapy;Electrical Stimulation;Ultrasound;Traction;Moist Heat;Therapeutic activities;Therapeutic exercise;Neuromuscular re-education;Patient/family education;Passive range of motion;Manual techniques;Dry needling   PT Next Visit Plan A/P mobs to Lt shoulder, manual and traction as needed.  Add some lifting with emphasis on scap stability.   PT Home Exercise Plan progress as needed   Consulted and Agree with Plan of Care Patient      Patient will benefit from skilled therapeutic intervention in order to improve the following deficits and impairments:  Pain, Decreased mobility, Decreased strength, Decreased activity tolerance, Increased muscle spasms, Decreased range of motion  Visit Diagnosis: Radiculopathy, cervical region  Other muscle spasm     Problem List Patient Active Problem List   Diagnosis Date Noted  . Normal coronary arteries 06/26/2015  . Elevated blood sugar 06/26/2015  . Family history of coronary artery disease 06/26/2015  . Diastolic dysfunction 123XX123  . Atypical chest pain   . Abnormal nuclear stress test   . Chest pain 05/28/2015  . Depression 05/28/2015  . Dyslipidemia 05/28/2015  . Benign essential HTN 05/28/2015  . Normocytic anemia 05/28/2015  . GERD (gastroesophageal reflux disease) 05/28/2015  . Paresthesia 05/28/2015  . Headache   . Paresthesias     Zannie Cove, PT 09/04/2016, 2:47 PM  [facility] [address], [place], [place],  [address] Phone: [phone]   Fax:  [phone]  Name: Kendra Deleon MRN: [id number] Date of Birth: 04-Jul-1951

## 2016-09-05 ENCOUNTER — Ambulatory Visit: Payer: Medicare Other | Admitting: Physical Therapy

## 2016-09-05 DIAGNOSIS — M62838 Other muscle spasm: Secondary | ICD-10-CM | POA: Diagnosis not present

## 2016-09-05 DIAGNOSIS — M5412 Radiculopathy, cervical region: Secondary | ICD-10-CM

## 2016-09-05 NOTE — Patient Instructions (Signed)
     Trigger Point Dry Needling  . What is Trigger Point Dry Needling (DN)? o DN is a physical therapy technique used to treat muscle pain and dysfunction. Specifically, DN helps deactivate muscle trigger points (muscle knots).  o A thin filiform needle is used to penetrate the skin and stimulate the underlying trigger point. The goal is for a local twitch response (LTR) to occur and for the trigger point to relax. No medication of any kind is injected during the procedure.   . What Does Trigger Point Dry Needling Feel Like?  o The procedure feels different for each individual patient. Some patients report that they do not actually feel the needle enter the skin and overall the process is not painful. Very mild bleeding may occur. However, many patients feel a deep cramping in the muscle in which the needle was inserted. This is the local twitch response.   . How Will I feel after the treatment? o Soreness is normal, and the onset of soreness may not occur for a few hours. Typically this soreness does not last longer than two days.  o Bruising is uncommon, however; ice can be used to decrease any possible bruising.  o In rare cases feeling tired or nauseous after the treatment is normal. In addition, your symptoms may get worse before they get better, this period will typically not last longer than 24 hours.   . What Can I do After My Treatment? o Increase your hydration by drinking more water for the next 24 hours. o You may place ice or heat on the areas treated that have become sore, however, do not use heat on inflamed or bruised areas. Heat often brings more relief post needling. o You can continue your regular activities, but vigorous activity is not recommended initially after the treatment for 24 hours. o DN is best combined with other physical therapy such as strengthening, stretching, and other therapies.    Stacy Simpson PT [facility] [address], [address] [place], [place] [address] Phone # [phone] Fax [phone] 

## 2016-09-05 NOTE — Therapy (Signed)
[facility] [address] [place], [place], [address] Phone: [phone]   Fax:  [phone]  Physical Therapy Treatment  Patient Details  Name: Kendra Deleon MRN: [id number] Date of Birth: 19-Nov-1950 Referring Provider: Dr. Jovita Gamma  Encounter Date: 09/05/2016      PT End of Session - 09/05/16 1056    Visit Number 13   Number of Visits 18   Date for PT Re-Evaluation 10/08/16   Authorization Type medicare 18th visit g-code   PT Start Time 1015   PT Stop Time 1105   PT Time Calculation (min) 50 min   Activity Tolerance Patient tolerated treatment well      Past Medical History:  Diagnosis Date  . Depression   . Hyperlipidemia   . Hypertension     Past Surgical History:  Procedure Laterality Date  . achilles tendon rupture Left   . BREAST REDUCTION SURGERY    . CARDIAC CATHETERIZATION N/A 05/29/2015   Procedure: Left Heart Cath and Coronary Angiography;  Surgeon: Jettie Booze, MD;  Location: [place] [facility];  Service: Cardiovascular;  Laterality: N/A;  . OOPHORECTOMY    . right knee menisectomy  Left     There were no vitals filed for this visit.      Subjective Assessment - 09/05/16 1020    Subjective The therapist could feel trigger points in left upper trap region.  Better after treatment and traction but pain returns.  Tingling in upper arm.     Currently in Pain? Yes   Pain Score 2    Pain Location Neck   Pain Orientation Left   Pain Type Chronic pain                         [facility] Adult PT Treatment/Exercise - 09/05/16 0001      Neck Exercises: Seated   Other Seated Exercise review of previous HEP and areas to emphasize post needling     Moist Heat Therapy   Number Minutes Moist Heat 10 Minutes   Moist Heat Location Cervical     Manual Therapy   Soft tissue mobilization Lt upper trap and rhomboids   Myofascial Release left upper trap stretch 3x 20 sec  holds   Scapular Mobilization medial and lateral glides; scapular distractions grade 3/4 10x each          Trigger Point Dry Needling - 09/05/16 1054    Consent Given? Yes   Education Handout Provided Yes  pneumothorax precautions   Muscles Treated Upper Body Upper trapezius;Levator scapulae;Rhomboids;Subscapularis  left cervical multifidi   Upper Trapezius Response Twitch reponse elicited;Palpable increased muscle length   Levator Scapulae Response Twitch response elicited;Palpable increased muscle length   Rhomboids Response Twitch response elicited;Palpable increased muscle length   Subscapularis Response Twitch response elicited;Palpable increased muscle length      Left only        PT Education - 09/05/16 1056    Education provided Yes   Education Details dry needling after care   Person(s) Educated Patient   Methods Explanation;Handout   Comprehension Verbalized understanding             PT Long Term Goals - 09/05/16 1246      PT LONG TERM GOAL #1   Title independent with HEP   Time 8   Period Weeks   Status On-going     PT LONG TERM GOAL #2   Title sleep  on side with min. difficulty   Time 8   Period Weeks   Status On-going     PT LONG TERM GOAL #3   Title walk dogs with 2 arms due to tingling in left arm decreased >/= 75%   Time 8   Status On-going     PT LONG TERM GOAL #4   Title left arm falling asleep 75% less with driving   Time 8   Period Weeks   Status On-going     PT LONG TERM GOAL #5   Title holding her 20# grandson with minimal difficulty due to pain decreased   Time 8   Period Weeks   Status On-going               Plan - 09/05/16 1240    Clinical Impression Statement The patient has multiple tender points/trigger points in left cervical musculature especially upper trapezius and rhomboids.   Following dry needling and manual therapy improved muscle length noted and therefore improved scapular mobility.   She may  benefit from additional dry needling including supraspinatus, infraspinatus and possibly scalenes/sternocleidomastoids.  Therapist closely monitoring response with all interventions.     PT Next Visit Plan assess response to dry needling #1;  manual therapy shoulder and neck;  mechanical traction;  scapular strengthening;  lifting with emphasis on scap stability       Patient will benefit from skilled therapeutic intervention in order to improve the following deficits and impairments:     Visit Diagnosis: Radiculopathy, cervical region  Other muscle spasm     Problem List Patient Active Problem List   Diagnosis Date Noted  . Normal coronary arteries 06/26/2015  . Elevated blood sugar 06/26/2015  . Family history of coronary artery disease 06/26/2015  . Diastolic dysfunction 123XX123  . Atypical chest pain   . Abnormal nuclear stress test   . Chest pain 05/28/2015  . Depression 05/28/2015  . Dyslipidemia 05/28/2015  . Benign essential HTN 05/28/2015  . Normocytic anemia 05/28/2015  . GERD (gastroesophageal reflux disease) 05/28/2015  . Paresthesia 05/28/2015  . Headache   . Paresthesias    Ruben Im, PT 09/05/16 12:48 PM Phone: [phone] Fax: [phone]  Alvera Singh 09/05/2016, 12:48 PM   [facility] [address], [place], [place], [address] Phone: [phone]   Fax:  [phone]  Name: LADAJA GLEICH MRN: [id number] Date of Birth: [birth year]

## 2016-09-09 ENCOUNTER — Encounter: Payer: BLUE CROSS/BLUE SHIELD | Admitting: Physical Therapy

## 2016-09-10 ENCOUNTER — Ambulatory Visit: Payer: Medicare Other

## 2016-09-10 DIAGNOSIS — M62838 Other muscle spasm: Secondary | ICD-10-CM | POA: Diagnosis not present

## 2016-09-10 DIAGNOSIS — M5412 Radiculopathy, cervical region: Secondary | ICD-10-CM | POA: Diagnosis not present

## 2016-09-10 NOTE — Therapy (Signed)
[facility] [address], [place], [place], [address] Phone: [phone]   Fax:  [phone]  Physical Therapy Treatment  Patient Details  Name: Kendra Deleon MRN: [id number] Date of Birth: [birth year] Referring Provider: Dr. Jovita Gamma  Encounter Date: 09/10/2016      PT End of Session - 09/10/16 1307    Visit Number 14   Number of Visits 18   Date for PT Re-Evaluation 10/08/16   Authorization Type medicare 18th visit g-code   PT Start Time 1230  dry needling   PT Stop Time 1313   PT Time Calculation (min) 43 min   Activity Tolerance Patient tolerated treatment well   Behavior During Therapy [facility] for tasks assessed/performed      Past Medical History:  Diagnosis Date  . Depression   . Hyperlipidemia   . Hypertension     Past Surgical History:  Procedure Laterality Date  . achilles tendon rupture Left   . BREAST REDUCTION SURGERY    . CARDIAC CATHETERIZATION N/A 05/29/2015   Procedure: Left Heart Cath and Coronary Angiography;  Surgeon: Jettie Booze, MD;  Location: [place] [facility];  Service: Cardiovascular;  Laterality: N/A;  . OOPHORECTOMY    . right knee menisectomy  Left     There were no vitals filed for this visit.      Subjective Assessment - 09/10/16 1228    Subjective Needling really helped me.  50% better since the start of care.     Currently in Pain? Yes   Pain Score 1    Pain Location Neck   Pain Orientation Left   Pain Descriptors / Indicators Aching   Pain Type Chronic pain   Pain Onset More than a month ago   Pain Frequency Intermittent   Aggravating Factors  end of the day   Pain Relieving Factors exercises, heat, dry needling                         [facility] Adult PT Treatment/Exercise - 09/10/16 0001      Moist Heat Therapy   Number Minutes Moist Heat 10 Minutes   Moist Heat Location Cervical     Manual Therapy   Manual Therapy Soft tissue  mobilization   Manual therapy comments pt in prone.  Trigger point release and elongation of rhobmoids, subscapularis and upper trap on the Lt          Trigger Point Dry Needling - 09/10/16 1301    Consent Given? Yes   Muscles Treated Upper Body Upper trapezius;Subscapularis;Rhomboids  Lt only   Upper Trapezius Response Twitch reponse elicited;Palpable increased muscle length   Rhomboids Response Palpable increased muscle length;Twitch response elicited   Subscapularis Response Twitch response elicited;Palpable increased muscle length                   PT Long Term Goals - 09/10/16 1231      PT LONG TERM GOAL #1   Title independent with HEP   Time 8   Period Weeks   Status On-going     PT LONG TERM GOAL #2   Title sleep on side with min. difficulty   Time 8   Period Weeks   Status On-going     PT LONG TERM GOAL #3   Title walk dogs with 2 arms due to tingling in left arm decreased >/= 75%   Time 8   Period Weeks  Status On-going     PT LONG TERM GOAL #4   Title left arm falling asleep 75% less with driving   Time 8   Period Weeks   Status On-going               Plan - 09/10/16 1304    Clinical Impression Statement Pt reports 50% overall improvement in symptoms since the start of care. Pt with multiple trigger points in Lt rhomboids and subscapularis and upper trap.  Pt demonstrated improved tissue mobility and less pain after needling today.  Pt will benefit from continued manual therapy, traction, dry needling and flexibility and strength. to reduce pain and improve function.     Rehab Potential Excellent   PT Frequency 1x / week   PT Duration 8 weeks   PT Treatment/Interventions Cryotherapy;Electrical Stimulation;Ultrasound;Traction;Moist Heat;Therapeutic activities;Therapeutic exercise;Neuromuscular re-education;Patient/family education;Passive range of motion;Manual techniques;Dry needling   PT Next Visit Plan assess response to dry needling  #2;  manual therapy shoulder and neck;  mechanical traction;  scapular strengthening;  lifting with emphasis on scap stability    Consulted and Agree with Plan of Care Patient      Patient will benefit from skilled therapeutic intervention in order to improve the following deficits and impairments:  Pain, Decreased mobility, Decreased strength, Decreased activity tolerance, Increased muscle spasms, Decreased range of motion  Visit Diagnosis: Radiculopathy, cervical region  Other muscle spasm     Problem List Patient Active Problem List   Diagnosis Date Noted  . Normal coronary arteries 06/26/2015  . Elevated blood sugar 06/26/2015  . Family history of coronary artery disease 06/26/2015  . Diastolic dysfunction 123XX123  . Atypical chest pain   . Abnormal nuclear stress test   . Chest pain 05/28/2015  . Depression 05/28/2015  . Dyslipidemia 05/28/2015  . Benign essential HTN 05/28/2015  . Normocytic anemia 05/28/2015  . GERD (gastroesophageal reflux disease) 05/28/2015  . Paresthesia 05/28/2015  . Headache   . Paresthesias      Sigurd Sos, PT 09/10/16 1:08 PM  [facility] [address], [place], [place], [address] Phone: [phone]   Fax:  [phone]  Name: Kendra Deleon MRN: [id number] Date of Birth: 1951-06-18

## 2016-09-11 ENCOUNTER — Encounter: Payer: BLUE CROSS/BLUE SHIELD | Admitting: Physical Therapy

## 2016-09-12 ENCOUNTER — Encounter: Payer: Self-pay | Admitting: Physical Therapy

## 2016-09-12 ENCOUNTER — Ambulatory Visit: Payer: Medicare Other | Admitting: Physical Therapy

## 2016-09-12 DIAGNOSIS — M5412 Radiculopathy, cervical region: Secondary | ICD-10-CM

## 2016-09-12 DIAGNOSIS — M62838 Other muscle spasm: Secondary | ICD-10-CM

## 2016-09-12 NOTE — Therapy (Signed)
[facility] [address] [place], [place], [address] Phone: [phone]   Fax:  [phone]  Physical Therapy Treatment  Patient Details  Name: Kendra Deleon MRN: [id number] Date of Birth: 1951/06/02 Referring Provider: Dr. Jovita Gamma  Encounter Date: 09/12/2016      PT End of Session - 09/12/16 1023    Visit Number 15   Number of Visits 18   Date for PT Re-Evaluation 10/08/16   Authorization Type medicare 18th visit g-code   PT Start Time 1017   PT Stop Time 1105   PT Time Calculation (min) 48 min   Activity Tolerance Patient tolerated treatment well   Behavior During Therapy [facility] for tasks assessed/performed      Past Medical History:  Diagnosis Date  . Depression   . Hyperlipidemia   . Hypertension     Past Surgical History:  Procedure Laterality Date  . achilles tendon rupture Left   . BREAST REDUCTION SURGERY    . CARDIAC CATHETERIZATION N/A 05/29/2015   Procedure: Left Heart Cath and Coronary Angiography;  Surgeon: Jettie Booze, MD;  Location: [place] [facility];  Service: Cardiovascular;  Laterality: N/A;  . OOPHORECTOMY    . right knee menisectomy  Left     There were no vitals filed for this visit.      Subjective Assessment - 09/12/16 1019    Subjective States just a slight "twinge" of something but not pain today.  States dry needling helped the first time, but even more the second time.  Reports she feels like her relief is lasting longer between sessions now.   Currently in Pain? No/denies                         [facility] Adult PT Treatment/Exercise - 09/12/16 0001      Shoulder Exercises: Supine   Other Supine Exercises isometric all ways in 90 deg flexion - 3 min     Shoulder Exercises: Standing   Extension Strengthening;20 reps;Theraband;Right;Left;10 reps   Theraband Level (Shoulder Extension) Level 3 (Green)   Row Strengthening;Both;20  reps;Theraband;10 reps   Theraband Level (Shoulder Row) Level 3 (Green)   Other Standing Exercises elbow extension - green band - 30x   Other Standing Exercises shouler 90 deg flex - wall up and down, side to side - 15 x each way     Shoulder Exercises: ROM/Strengthening   UBE (Upper Arm Bike) back 10 minutes     Moist Heat Therapy   Number Minutes Moist Heat 10 Minutes   Moist Heat Location Cervical     Manual Therapy   Manual therapy comments pt supine   Joint Mobilization A/P glenohumeral mobs grade II-III   Soft tissue mobilization Lt anterior delt, bicep, pec minor                     PT Long Term Goals - 09/12/16 1021      PT LONG TERM GOAL #1   Title independent with HEP   Baseline still adding to final HEP   Time 8   Period Weeks   Status On-going     PT LONG TERM GOAL #2   Title sleep on side with min. difficulty   Time 8   Period Weeks   Status Achieved     PT LONG TERM GOAL #4   Title left arm falling asleep 75% less with driving   Baseline [id number]  improved   Time 8   Period Weeks   Status On-going               Plan - 09/12/16 1056    Clinical Impression Statement Pt continues to demonstrate progress,  Increased symptoms with activities when reaching forward with Lt arm.  Continues to need PT for soft tissue release and shoulder stability n order to return to full function.   Rehab Potential Excellent   PT Frequency 1x / week   PT Duration 8 weeks   PT Treatment/Interventions Cryotherapy;Electrical Stimulation;Ultrasound;Traction;Moist Heat;Therapeutic activities;Therapeutic exercise;Neuromuscular re-education;Patient/family education;Passive range of motion;Manual techniques;Dry needling   PT Next Visit Plan assess Lt anterior deltoid and bicep and dry needle if needed, manual as needed, scapula and RTC strengthening   PT Home Exercise Plan review and add tricep pull downs with band   Consulted and Agree with Plan of Care Patient       Patient will benefit from skilled therapeutic intervention in order to improve the following deficits and impairments:  Pain, Decreased mobility, Decreased strength, Decreased activity tolerance, Increased muscle spasms, Decreased range of motion  Visit Diagnosis: Radiculopathy, cervical region  Other muscle spasm     Problem List Patient Active Problem List   Diagnosis Date Noted  . Normal coronary arteries 06/26/2015  . Elevated blood sugar 06/26/2015  . Family history of coronary artery disease 06/26/2015  . Diastolic dysfunction 123XX123  . Atypical chest pain   . Abnormal nuclear stress test   . Chest pain 05/28/2015  . Depression 05/28/2015  . Dyslipidemia 05/28/2015  . Benign essential HTN 05/28/2015  . Normocytic anemia 05/28/2015  . GERD (gastroesophageal reflux disease) 05/28/2015  . Paresthesia 05/28/2015  . Headache   . Paresthesias     Zannie Cove, PT 09/12/2016, 9:31 PM  [facility] [address], [place], [place], [address] Phone: [phone]   Fax:  [phone]  Name: Kendra Deleon MRN: [id number] Date of Birth: 08/31/1951

## 2016-09-16 ENCOUNTER — Ambulatory Visit: Payer: Medicare Other

## 2016-09-16 DIAGNOSIS — M5412 Radiculopathy, cervical region: Secondary | ICD-10-CM

## 2016-09-16 DIAGNOSIS — M62838 Other muscle spasm: Secondary | ICD-10-CM

## 2016-09-16 NOTE — Therapy (Signed)
[facility] [address], [place], [place], [address] Phone: [phone]   Fax:  [phone]  Physical Therapy Treatment  Patient Details  Name: Kendra Deleon MRN: [id number] Date of Birth: 01-Feb-1951 Referring Provider: Dr. Jovita Gamma  Encounter Date: 09/16/2016      PT End of Session - 09/16/16 1225    Visit Number 16   Number of Visits 18   Date for PT Re-Evaluation 10/08/16   Authorization Type medicare 18th visit g-code   PT Start Time [id number]   PT Stop Time 1236   PT Time Calculation (min) 51 min   Activity Tolerance Patient tolerated treatment well   Behavior During Therapy [facility] for tasks assessed/performed      Past Medical History:  Diagnosis Date  . Depression   . Hyperlipidemia   . Hypertension     Past Surgical History:  Procedure Laterality Date  . achilles tendon rupture Left   . BREAST REDUCTION SURGERY    . CARDIAC CATHETERIZATION N/A 05/29/2015   Procedure: Left Heart Cath and Coronary Angiography;  Surgeon: Jettie Booze, MD;  Location: [place] [facility];  Service: Cardiovascular;  Laterality: N/A;  . OOPHORECTOMY    . right knee menisectomy  Left     There were no vitals filed for this visit.      Subjective Assessment - 09/16/16 1142    Subjective I had a bad night last night.  I feel like the cold weather makes it worse.     How long can you sit comfortably? as the day progresses the length of time gets less, but feels better   Currently in Pain? Yes   Pain Score 2    Pain Location Neck   Pain Orientation Left   Pain Descriptors / Indicators Aching   Pain Type Chronic pain   Pain Onset More than a month ago   Pain Frequency Intermittent   Aggravating Factors  sleep at night, cold weather   Pain Relieving Factors heat, dry needling                         [facility] Adult PT Treatment/Exercise - 09/16/16 0001      Shoulder Exercises: ROM/Strengthening    UBE (Upper Arm Bike) Level 1 x 10 minutes (5/5)  verbal cues for posture, PT present to discuss goals     Moist Heat Therapy   Number Minutes Moist Heat 10 Minutes   Moist Heat Location Cervical     Manual Therapy   Manual Therapy Soft tissue mobilization   Manual therapy comments pt in prone.  Trigger point release and elongation of rhobmoids, subscapularis and upper trap on the Lt          Trigger Point Dry Needling - 09/16/16 1140    Consent Given? Yes   Muscles Treated Upper Body Upper trapezius;Rhomboids;Subscapularis  Lt only   Upper Trapezius Response Twitch reponse elicited;Palpable increased muscle length   Rhomboids Response Twitch response elicited;Palpable increased muscle length   Subscapularis Response Twitch response elicited;Palpable increased muscle length                   PT Long Term Goals - 09/16/16 1147      PT LONG TERM GOAL #1   Title independent with HEP   Time 8   Period Weeks   Status On-going     PT LONG TERM GOAL #2   Title sleep on  side with min. difficulty   Status Achieved     PT LONG TERM GOAL #4   Title left arm falling asleep 75% less with driving   Baseline [id number] improved   Time 8   Period Weeks   Status On-going     PT LONG TERM GOAL #5   Title holding her 20# grandson with minimal difficulty due to pain decreased   Time 8   Period Weeks   Status On-going               Plan - 09/16/16 1148    Clinical Impression Statement Pt reports 50% overall improvement in symptoms since the start of care.  Pt with Lt upper trap and neck pain and trigger points.  Pt with improved mobility after dry needling today.  Pt has not seen her grandson to assess lifting goal.  Pt will continue to benefit from skilled PT for postural strength, flexiblity, manual and modalities.     Rehab Potential Excellent   PT Frequency 1x / week   PT Duration 8 weeks   PT Treatment/Interventions Cryotherapy;Electrical  Stimulation;Ultrasound;Traction;Moist Heat;Therapeutic activities;Therapeutic exercise;Neuromuscular re-education;Patient/family education;Passive range of motion;Manual techniques;Dry needling   PT Next Visit Plan postural strenth and flexibility, manual as needed, scapula and RTC strengthening   Consulted and Agree with Plan of Care Patient      Patient will benefit from skilled therapeutic intervention in order to improve the following deficits and impairments:  Pain, Decreased mobility, Decreased strength, Decreased activity tolerance, Increased muscle spasms, Decreased range of motion  Visit Diagnosis: Radiculopathy, cervical region  Other muscle spasm     Problem List Patient Active Problem List   Diagnosis Date Noted  . Normal coronary arteries 06/26/2015  . Elevated blood sugar 06/26/2015  . Family history of coronary artery disease 06/26/2015  . Diastolic dysfunction 123XX123  . Atypical chest pain   . Abnormal nuclear stress test   . Chest pain 05/28/2015  . Depression 05/28/2015  . Dyslipidemia 05/28/2015  . Benign essential HTN 05/28/2015  . Normocytic anemia 05/28/2015  . GERD (gastroesophageal reflux disease) 05/28/2015  . Paresthesia 05/28/2015  . Headache   . Paresthesias     Sigurd Sos, PT 09/16/16 12:28 PM  [facility] [address] [place], [place], [address] Phone: [phone]   Fax:  [phone]  Name: Kendra Deleon MRN: [id number] Date of Birth: 03/24/1951

## 2016-09-18 ENCOUNTER — Encounter: Payer: BLUE CROSS/BLUE SHIELD | Admitting: Physical Therapy

## 2016-09-25 ENCOUNTER — Encounter: Payer: Self-pay | Admitting: Physical Therapy

## 2016-09-25 ENCOUNTER — Ambulatory Visit: Payer: Medicare Other | Admitting: Physical Therapy

## 2016-09-25 DIAGNOSIS — M62838 Other muscle spasm: Secondary | ICD-10-CM

## 2016-09-25 DIAGNOSIS — M5412 Radiculopathy, cervical region: Secondary | ICD-10-CM

## 2016-09-25 NOTE — Therapy (Signed)
[facility] [address] [place], [place], [address] Phone: [phone]   Fax:  [phone]  Physical Therapy Treatment  Patient Details  Name: Kendra Deleon MRN: [id number] Date of Birth: [birth year] Referring Provider: Dr. Jovita Gamma  Encounter Date: 09/25/2016      PT End of Session - 09/25/16 1235    Visit Number 17   Number of Visits 18   Date for PT Re-Evaluation 10/08/16   Authorization Type medicare 18th visit g-code   PT Start Time 1231   PT Stop Time 1314   PT Time Calculation (min) 43 min   Activity Tolerance Patient tolerated treatment well   Behavior During Therapy [facility] for tasks assessed/performed      Past Medical History:  Diagnosis Date  . Depression   . Hyperlipidemia   . Hypertension     Past Surgical History:  Procedure Laterality Date  . achilles tendon rupture Left   . BREAST REDUCTION SURGERY    . CARDIAC CATHETERIZATION N/A 05/29/2015   Procedure: Left Heart Cath and Coronary Angiography;  Surgeon: Jettie Booze, MD;  Location: [facility];  Service: Cardiovascular;  Laterality: N/A;  . OOPHORECTOMY    . right knee menisectomy  Left     There were no vitals filed for this visit.      Subjective Assessment - 09/25/16 1330    Subjective I haven't thought about my shoulder and was not able to do the exercises.  pt reports she was away at aunts funeral and just got back yesterday.  States she will be going to see her grandson this weekend and will see how she does lifting.  Denies pain but states she can feels some discomfort today.   How long can you sit comfortably? as the day progresses the length of time gets less, but feels better   Currently in Pain? No/denies                         [facility] PT Treatment/Exercise - 09/25/16 0001      Shoulder Exercises: Standing   External Rotation Strengthening;Both;20 reps;Theraband   Theraband  Level (Shoulder External Rotation) Level 2 (Red)   Other Standing Exercises drawing sword - red band Lt UE - 15 x  cues for scapula depression   Other Standing Exercises wall push up 3x10  cues for cervical retraction and scap depression     Shoulder Exercises: ROM/Strengthening   UBE (Upper Arm Bike) Level 2 x 10 minutes (5/3) fwd/back     Manual Therapy   Manual Therapy Soft tissue mobilization;Manual Traction   Manual therapy comments pt supine   Joint Mobilization A/P glenohumeral mobs grade II-III   Soft tissue mobilization Lt anterior delt, bicep, pec minor   Manual Traction cervical                     PT Long Term Goals - 09/25/16 1236      PT LONG TERM GOAL #1   Title independent with HEP   Time 8   Period Weeks   Status On-going     PT LONG TERM GOAL #2   Title sleep on side with min. difficulty   Time 8   Period Weeks   Status Achieved     PT LONG TERM GOAL #3   Title walk dogs with 2 arms due to tingling in left arm decreased >/= 75%  Baseline not as much, 50% less   Time 8   Period Weeks   Status On-going     PT LONG TERM GOAL #4   Title left arm falling asleep 75% less with driving   Baseline not as much, 50% improved   Time 8   Period Weeks   Status On-going               Plan - 09/25/16 1333    Clinical Impression Statement Pt was unable to demonstrate improvements today and was away last week due to death in the family.  States overall she is improved and feels that dry needling is helping.  Will need to re-assess progress at next visit and continue postural strength, flexibility and manual modalities.   Rehab Potential Excellent   Clinical Impairments Affecting Rehab Potential None   PT Frequency 1x / week   PT Duration 8 weeks   PT Treatment/Interventions Cryotherapy;Electrical Stimulation;Ultrasound;Traction;Moist Heat;Therapeutic activities;Therapeutic exercise;Neuromuscular re-education;Patient/family education;Passive  range of motion;Manual techniques;Dry needling   PT Next Visit Plan postural strenth and flexibility, manual as needed, scapula and RTC strengthening   Consulted and Agree with Plan of Care Patient      Patient will benefit from skilled therapeutic intervention in order to improve the following deficits and impairments:  Pain, Decreased mobility, Decreased strength, Decreased activity tolerance, Increased muscle spasms, Decreased range of motion  Visit Diagnosis: Radiculopathy, cervical region  Other muscle spasm     Problem List Patient Active Problem List   Diagnosis Date Noted  . Normal coronary arteries 06/26/2015  . Elevated blood sugar 06/26/2015  . Family history of coronary artery disease 06/26/2015  . Diastolic dysfunction 123XX123  . Atypical chest pain   . Abnormal nuclear stress test   . Chest pain 05/28/2015  . Depression 05/28/2015  . Dyslipidemia 05/28/2015  . Benign essential HTN 05/28/2015  . Normocytic anemia 05/28/2015  . GERD (gastroesophageal reflux disease) 05/28/2015  . Paresthesia 05/28/2015  . Headache   . Paresthesias     Zannie Cove, PT 09/25/2016, 1:35 PM  [facility] [address], [place], [place], [address] Phone: [phone]   Fax:  [phone]  Name: Kendra Deleon MRN: [id number] Date of Birth: Apr 03, 1951

## 2016-09-29 ENCOUNTER — Ambulatory Visit: Payer: Medicare Other

## 2016-09-29 DIAGNOSIS — M5412 Radiculopathy, cervical region: Secondary | ICD-10-CM

## 2016-09-29 DIAGNOSIS — M62838 Other muscle spasm: Secondary | ICD-10-CM | POA: Diagnosis not present

## 2016-09-29 NOTE — Therapy (Signed)
[facility] [address] [place], [place], [address] Phone: [phone]   Fax:  [phone]  Physical Therapy Treatment  Patient Details  Name: Kendra Deleon MRN: [id number] Date of Birth: 23-Sep-1950 Referring Provider: Dr. Jovita Gamma  Encounter Date: 09/29/2016      PT End of Session - 09/29/16 1139    Visit Number 18   Number of Visits 28   Date for PT Re-Evaluation 10/08/16   Authorization Type G-code 28th visit   PT Start Time 1100   PT Stop Time 1152   PT Time Calculation (min) 52 min   Activity Tolerance Patient tolerated treatment well   Behavior During Therapy [facility] for tasks assessed/performed      Past Medical History:  Diagnosis Date  . Depression   . Hyperlipidemia   . Hypertension     Past Surgical History:  Procedure Laterality Date  . achilles tendon rupture Left   . BREAST REDUCTION SURGERY    . CARDIAC CATHETERIZATION N/A 05/29/2015   Procedure: Left Heart Cath and Coronary Angiography;  Surgeon: Jettie Booze, MD;  Location: [facility];  Service: Cardiovascular;  Laterality: N/A;  . OOPHORECTOMY    . right knee menisectomy  Left     There were no vitals filed for this visit.      Subjective Assessment - 09/29/16 1054    Subjective I have been picking up my grandson a lot.  Pain is now in my neck.     Currently in Pain? Yes   Pain Location Neck   Pain Orientation Left   Pain Descriptors / Indicators Aching;Spasm;Sore   Pain Type Chronic pain   Pain Onset More than a month ago   Pain Frequency Intermittent   Aggravating Factors  lifting grandson, sleep at night, cold weather   Pain Relieving Factors heat, stretching, dry needling            [facility] PT Assessment - 09/29/16 0001      Observation/Other Assessments   Focus on Therapeutic Outcomes (FOTO)  46% limitation                     [facility] Adult PT Treatment/Exercise - 09/29/16 0001       Shoulder Exercises: ROM/Strengthening   UBE (Upper Arm Bike) Level 1 x 10 minutes (5/5)  verbal cues for posture, PT present to discuss goals     Moist Heat Therapy   Number Minutes Moist Heat 10 Minutes   Moist Heat Location Cervical     Manual Therapy   Manual Therapy Soft tissue mobilization   Manual therapy comments pt in prone.  Trigger point release and elongation of rhobmoids, subscapularis and upper trap on the Lt          Trigger Point Dry Needling - 09/29/16 1110    Consent Given? Yes   Muscles Treated Upper Body Upper trapezius;Rhomboids;Subscapularis  Lt only   Upper Trapezius Response Twitch reponse elicited;Palpable increased muscle length   Rhomboids Response Twitch response elicited;Palpable increased muscle length   Subscapularis Response Twitch response elicited;Palpable increased muscle length                   PT Long Term Goals - 09/29/16 1142      PT LONG TERM GOAL #5   Title holding her 20# grandson with minimal difficulty due to pain decreased   Time 8   Period Weeks   Status On-going  Plan - 10-07-16 1106    Clinical Impression Statement Pt with increased pain in the Lt neck and arm due to lifting her grandson a lot over the past few days.  FOTO is improved to 46% limitation (improved from 53% limitation).  Pt with tension and trigger points in the Lt neck and upper trap and demonstrated improved tissue mobility after dry needling today.  Pt will have reassessment next session to determine the need for future PT.     Rehab Potential Excellent   PT Frequency 1x / week   PT Duration 8 weeks   PT Treatment/Interventions Cryotherapy;Electrical Stimulation;Ultrasound;Traction;Moist Heat;Therapeutic activities;Therapeutic exercise;Neuromuscular re-education;Patient/family education;Passive range of motion;Manual techniques;Dry needling   PT Next Visit Plan Re-evaluation next session, probable renewal for 2-4 weeks to allow  for 2 more dry needling sessions.  assess response to dry needling. postural strenth and flexibility, manual as needed, scapula and RTC strengthening   Consulted and Agree with Plan of Care Patient      Patient will benefit from skilled therapeutic intervention in order to improve the following deficits and impairments:  Pain, Decreased mobility, Decreased strength, Decreased activity tolerance, Increased muscle spasms, Decreased range of motion  Visit Diagnosis: Radiculopathy, cervical region  Other muscle spasm       G-Codes - 2016/10/07 1105    Functional Assessment Tool Used FOTO: 46% limitation   Functional Limitation Changing and maintaining body position   Changing and Maintaining Body Position Current Status [id number]) At least 40 percent but less than 60 percent impaired, limited or restricted   Changing and Maintaining Body Position Goal Status [id number]) At least 20 percent but less than 40 percent impaired, limited or restricted      Problem List Patient Active Problem List   Diagnosis Date Noted  . Normal coronary arteries 06/26/2015  . Elevated blood sugar 06/26/2015  . Family history of coronary artery disease 06/26/2015  . Diastolic dysfunction 123XX123  . Atypical chest pain   . Abnormal nuclear stress test   . Chest pain 05/28/2015  . Depression 05/28/2015  . Dyslipidemia 05/28/2015  . Benign essential HTN 05/28/2015  . Normocytic anemia 05/28/2015  . GERD (gastroesophageal reflux disease) 05/28/2015  . Paresthesia 05/28/2015  . Headache   . Paresthesias      Sigurd Sos, PT 10-07-16 11:42 AM  [facility] [address] [place], [place], [address] Phone: [phone]   Fax:  [phone]  Name: Kendra Deleon MRN: [id number] Date of Birth: November 13, 1950

## 2016-10-01 ENCOUNTER — Ambulatory Visit: Payer: Medicare Other | Admitting: Physical Therapy

## 2016-10-01 DIAGNOSIS — M62838 Other muscle spasm: Secondary | ICD-10-CM

## 2016-10-01 DIAGNOSIS — M5412 Radiculopathy, cervical region: Secondary | ICD-10-CM | POA: Diagnosis not present

## 2016-10-01 NOTE — Therapy (Signed)
[facility] [address], [place], [place], [address] Phone: [phone]   Fax:  [phone]  Physical Therapy Treatment  Patient Details  Name: Kendra Deleon MRN: [id number] Date of Birth: [birth year] Referring Provider: Dr. Jovita Gamma  Encounter Date: 10/01/2016      PT End of Session - 10/01/16 1118    Visit Number 19   Number of Visits 28   Date for PT Re-Evaluation 10/29/16   Authorization Type G-code 28th visit   PT Start Time 1105   PT Stop Time 1145   PT Time Calculation (min) 40 min   Activity Tolerance Patient tolerated treatment well   Behavior During Therapy [facility] for tasks assessed/performed      Past Medical History:  Diagnosis Date  . Depression   . Hyperlipidemia   . Hypertension     Past Surgical History:  Procedure Laterality Date  . achilles tendon rupture Left   . BREAST REDUCTION SURGERY    . CARDIAC CATHETERIZATION N/A 05/29/2015   Procedure: Left Heart Cath and Coronary Angiography;  Surgeon: Jettie Booze, MD;  Location: [facility];  Service: Cardiovascular;  Laterality: N/A;  . OOPHORECTOMY    . right knee menisectomy  Left     There were no vitals filed for this visit.      Subjective Assessment - 10/01/16 1105    Subjective States she had a lot of pain last time.  But she doesn't have pain today but has some heavy feeling in Lt arm.  Painful when arm is hanging forward (like face down in bed).  Painful reaching back and painful rotation to left.   Limitations Reading;Lifting   Patient Stated Goals reduce pain so she does not have surgery   Currently in Pain? No/denies            [facility] PT Assessment - 10/01/16 0001      Observation/Other Assessments   Focus on Therapeutic Outcomes (FOTO)  46% limitation     AROM   Cervical Flexion 68   Cervical Extension 40   Cervical - Right Side Bend 35   Cervical - Left Side Bend 35   Cervical - Right  Rotation 80   Cervical - Left Rotation 48                     [facility] Adult PT Treatment/Exercise - 10/01/16 0001      Shoulder Exercises: Prone   Flexion --   Extension Strengthening;Left;20 reps   External Rotation Strengthening;Left;20 reps   Horizontal ABduction 1 Strengthening;Left;5 reps     Shoulder Exercises: Standing   External Rotation --   Theraband Level (Shoulder External Rotation) --   Extension --   Theraband Level (Shoulder Extension) --   Row --   Theraband Level (Shoulder Row) --   Other Standing Exercises wall push up 3x10  cues for cervical retraction and scap depression     Shoulder Exercises: ROM/Strengthening   UBE (Upper Arm Bike) Level 1 x 10 minutes (5/5)  verbal cues for posture, PT present to discuss goals     Manual Therapy   Manual Therapy Soft tissue mobilization;Joint mobilization   Manual therapy comments supine   Joint Mobilization A/P glenohumeral mobs grade II-III   Soft tissue mobilization Lt anterior and lateral delt, bicep, pec minor                     PT Long Term  Goals - 10-20-16 1154      PT LONG TERM GOAL #1   Title independent with HEP   Time 8   Period Weeks   Status On-going     PT LONG TERM GOAL #2   Title sleep on side with min. difficulty   Time 8   Period Weeks   Status Achieved     PT LONG TERM GOAL #3   Title walk dogs with 2 arms due to tingling in left arm decreased >/= 75%   Time 8   Period Weeks   Status On-going     PT LONG TERM GOAL #4   Title left arm falling asleep 75% less with driving   Time 8   Period Weeks   Status On-going     PT LONG TERM GOAL #5   Title holding her 20# grandson with minimal difficulty due to pain decreased   Time 8   Period Weeks   Status On-going               Plan - 10-20-16 1147    Clinical Impression Statement Pt overall improved function and ROM.  Pt can tolerate more activity including picking up grandson, however is still  getting some muscle tightness and increased flare up of symptoms down her arm.  She was turning to the Left and reaching back to the left as well which was also increasing her pain.  Pt unable to do certain cervical movments due to structural limitations. She will still benefit from skilled PT to release all remaining trigger points with up to 4 more DN treatments and to continue working on strenghtening for imrpoved posture so she will maintain god cervical alignement and be able to avoid aggravating positions.     Rehab Potential Excellent   Clinical Impairments Affecting Rehab Potential None   PT Frequency 2x / week   PT Duration 8 weeks   PT Treatment/Interventions Cryotherapy;Electrical Stimulation;Ultrasound;Traction;Moist Heat;Therapeutic activities;Therapeutic exercise;Neuromuscular re-education;Patient/family education;Passive range of motion;Manual techniques;Dry needling   PT Next Visit Plan f/u with patient on doctor visit; continue DN for trigger point release and strenghtening for postural alignement and to reinforce avoid positions that will flare up symptoms   Consulted and Agree with Plan of Care Patient      Patient will benefit from skilled therapeutic intervention in order to improve the following deficits and impairments:  Pain, Decreased mobility, Decreased strength, Decreased activity tolerance, Increased muscle spasms, Decreased range of motion  Visit Diagnosis: Radiculopathy, cervical region - Plan: PT plan of care cert/re-cert  Other muscle spasm - Plan: PT plan of care cert/re-cert       G-Codes - 10/20/2016 1153    Functional Assessment Tool Used FOTO: 46% limitation   Functional Limitation Changing and maintaining body position   Changing and Maintaining Body Position Current Status [id number]) At least 40 percent but less than 60 percent impaired, limited or restricted   Changing and Maintaining Body Position Goal Status [id number]) At least 20 percent but less than 40  percent impaired, limited or restricted      Problem List Patient Active Problem List   Diagnosis Date Noted  . Normal coronary arteries 06/26/2015  . Elevated blood sugar 06/26/2015  . Family history of coronary artery disease 06/26/2015  . Diastolic dysfunction 123XX123  . Atypical chest pain   . Abnormal nuclear stress test   . Chest pain 05/28/2015  . Depression 05/28/2015  . Dyslipidemia 05/28/2015  . Benign essential HTN 05/28/2015  .  Normocytic anemia 05/28/2015  . GERD (gastroesophageal reflux disease) 05/28/2015  . Paresthesia 05/28/2015  . Headache   . Paresthesias     Zannie Cove, PT 10/01/2016, 12:01 PM  [facility] [address], [place], [place], [address] Phone: [phone]   Fax:  [phone]  Name: Kendra Deleon MRN: [id number] Date of Birth: 1950/09/11

## 2016-10-03 DIAGNOSIS — M5030 Other cervical disc degeneration, unspecified cervical region: Secondary | ICD-10-CM | POA: Diagnosis not present

## 2016-10-03 DIAGNOSIS — M5412 Radiculopathy, cervical region: Secondary | ICD-10-CM | POA: Diagnosis not present

## 2016-10-03 DIAGNOSIS — M5020 Other cervical disc displacement, unspecified cervical region: Secondary | ICD-10-CM | POA: Diagnosis not present

## 2016-10-03 DIAGNOSIS — M4722 Other spondylosis with radiculopathy, cervical region: Secondary | ICD-10-CM | POA: Diagnosis not present

## 2016-10-03 DIAGNOSIS — R29898 Other symptoms and signs involving the musculoskeletal system: Secondary | ICD-10-CM | POA: Diagnosis not present

## 2016-10-03 DIAGNOSIS — M542 Cervicalgia: Secondary | ICD-10-CM | POA: Diagnosis not present

## 2016-10-06 ENCOUNTER — Ambulatory Visit: Payer: Medicare Other | Attending: Neurosurgery

## 2016-10-06 DIAGNOSIS — M5412 Radiculopathy, cervical region: Secondary | ICD-10-CM | POA: Insufficient documentation

## 2016-10-06 DIAGNOSIS — M62838 Other muscle spasm: Secondary | ICD-10-CM

## 2016-10-06 NOTE — Therapy (Signed)
[facility] [address], [place], [place], [address] Phone: [phone]   Fax:  [phone]  Physical Therapy Treatment  Patient Details  Name: Kendra Deleon MRN: [id number] Date of Birth: 01/29/1951 Referring Provider: Dr. Jovita Gamma  Encounter Date: 10/06/2016      PT End of Session - 10/06/16 1523    Visit Number 20   Number of Visits 28   Date for PT Re-Evaluation 10/29/16   Authorization Type G-code 28th visit   PT Start Time 1446   PT Stop Time 1536   PT Time Calculation (min) 50 min   Activity Tolerance Patient tolerated treatment well   Behavior During Therapy [facility] for tasks assessed/performed      Past Medical History:  Diagnosis Date  . Depression   . Hyperlipidemia   . Hypertension     Past Surgical History:  Procedure Laterality Date  . achilles tendon rupture Left   . BREAST REDUCTION SURGERY    . CARDIAC CATHETERIZATION N/A 05/29/2015   Procedure: Left Heart Cath and Coronary Angiography;  Surgeon: Jettie Booze, MD;  Location: [facility];  Service: Cardiovascular;  Laterality: N/A;  . OOPHORECTOMY    . right knee menisectomy  Left     There were no vitals filed for this visit.      Subjective Assessment - 10/06/16 1449    Subjective Pt saw MD and he discussed underlying condition in neck that is impacting Lt UE strength and neck pain.     How long can you sit comfortably? as the day progresses the length of time gets less, but feels better   Currently in Pain? Yes   Pain Location Neck   Pain Orientation Left   Pain Descriptors / Indicators Aching;Spasm;Sore   Pain Type Chronic pain   Pain Radiating Towards Lt arm   Pain Onset More than a month ago   Pain Frequency Intermittent   Aggravating Factors  lifting grandson, sleep at night, cold weather   Pain Relieving Factors heat stretching, dry needling                         [facility] Adult PT  Treatment/Exercise - 10/06/16 0001      Shoulder Exercises: Seated   External Rotation Strengthening;Both;20 reps;Theraband   Theraband Level (Shoulder External Rotation) Level 2 (Red)   Flexion Strengthening;Both;20 reps;Weights   Flexion Weight (lbs) 1   Abduction Strengthening;Both;20 reps;Weights   ABduction Weight (lbs) 1   ABduction Limitations scaption 1# 2x10   Other Seated Exercises biceps curls: red band 2x10     Shoulder Exercises: Standing   Other Standing Exercises wall push up 3x10  cues for cervical retraction and scap depression     Shoulder Exercises: ROM/Strengthening   UBE (Upper Arm Bike) Level 1 x 10 minutes (5/5)  verbal cues for posture, PT present to discuss goals     Traction   Type of Traction Cervical   Min (lbs) 5   Max (lbs) 20   Hold Time 60 sec   Rest Time 10 sec   Time 15 min                     PT Long Term Goals - 10/06/16 1457      PT LONG TERM GOAL #1   Title independent with HEP   Time 8   Period Weeks   Status On-going  PT LONG TERM GOAL #2   Title sleep on side with min. difficulty   Status Achieved     PT LONG TERM GOAL #3   Title walk dogs with 2 arms due to tingling in left arm decreased >/= 75%   Time 8   Period Weeks   Status On-going     PT LONG TERM GOAL #4   Title left arm falling asleep 75% less with driving   Time 8   Period Weeks   Status On-going     PT LONG TERM GOAL #5   Title holding her 20# grandson with minimal difficulty due to pain decreased   Time 8   Period Weeks   Status On-going               Plan - 10/06/16 1459    Clinical Impression Statement Pt saw MD and he feels like pt will need surgery due to Lt UE weakness and continued pain into the Lt arm.  Pt with limited cervical A/ROM and Lt UE weakness.  Pt with intermittent flare-up of Lt UE pain and muscle tension based on activity.  Pt will benefit from skilled PT for Lt strength, traction and manual as needed.      Rehab Potential Excellent   PT Frequency 2x / week   PT Duration 8 weeks   PT Treatment/Interventions Cryotherapy;Electrical Stimulation;Ultrasound;Traction;Moist Heat;Therapeutic activities;Therapeutic exercise;Neuromuscular re-education;Patient/family education;Passive range of motion;Manual techniques;Dry needling   PT Next Visit Plan Traction, UE strength, postural strength   Consulted and Agree with Plan of Care Patient      Patient will benefit from skilled therapeutic intervention in order to improve the following deficits and impairments:  Pain, Decreased mobility, Decreased strength, Decreased activity tolerance, Increased muscle spasms, Decreased range of motion  Visit Diagnosis: Radiculopathy, cervical region  Other muscle spasm     Problem List Patient Active Problem List   Diagnosis Date Noted  . Normal coronary arteries 06/26/2015  . Elevated blood sugar 06/26/2015  . Family history of coronary artery disease 06/26/2015  . Diastolic dysfunction 123XX123  . Atypical chest pain   . Abnormal nuclear stress test   . Chest pain 05/28/2015  . Depression 05/28/2015  . Dyslipidemia 05/28/2015  . Benign essential HTN 05/28/2015  . Normocytic anemia 05/28/2015  . GERD (gastroesophageal reflux disease) 05/28/2015  . Paresthesia 05/28/2015  . Headache   . Paresthesias      Sigurd Sos, PT 10/06/16 3:26 PM   [facility] [address] [place], [place], [address] Phone: [phone]   Fax:  [phone]  Name: JENNICE MULRONEY MRN: [id number] Date of Birth: 09/18/1950

## 2016-10-13 ENCOUNTER — Ambulatory Visit: Payer: Medicare Other

## 2016-10-13 DIAGNOSIS — M62838 Other muscle spasm: Secondary | ICD-10-CM | POA: Diagnosis not present

## 2016-10-13 DIAGNOSIS — M5412 Radiculopathy, cervical region: Secondary | ICD-10-CM | POA: Diagnosis not present

## 2016-10-13 NOTE — Therapy (Signed)
[facility] [address] [place], [place], [address] Phone: [phone]   Fax:  [phone]  Physical Therapy Treatment  Patient Details  Name: Kendra Deleon MRN: [id number] Date of Birth: 05-14-1951 Referring Provider: Dr. Jovita Gamma  Encounter Date: 10/13/2016      PT End of Session - 10/13/16 1432    Visit Number 21   Number of Visits 28   Date for PT Re-Evaluation 10/29/16   Authorization Type G-code 28th visit   PT Start Time 1400   PT Stop Time 1450   PT Time Calculation (min) 50 min   Activity Tolerance Patient tolerated treatment well   Behavior During Therapy [facility] for tasks assessed/performed      Past Medical History:  Diagnosis Date  . Depression   . Hyperlipidemia   . Hypertension     Past Surgical History:  Procedure Laterality Date  . achilles tendon rupture Left   . BREAST REDUCTION SURGERY    . CARDIAC CATHETERIZATION N/A 05/29/2015   Procedure: Left Heart Cath and Coronary Angiography;  Surgeon: Jettie Booze, MD;  Location: [place] [facility];  Service: Cardiovascular;  Laterality: N/A;  . OOPHORECTOMY    . right knee menisectomy  Left     There were no vitals filed for this visit.      Subjective Assessment - 10/13/16 1357    Subjective I felt good after traction last session.  Slipped yesterday on wet brick yesterday, didn't fall, now neck is feeling sore.     Currently in Pain? Yes   Pain Score 5    Pain Location Neck   Pain Orientation Left   Pain Descriptors / Indicators Sore;Aching;Spasm   Pain Type Chronic pain   Pain Onset More than a month ago   Pain Frequency Intermittent   Aggravating Factors  lifting, sleep, cold weather   Pain Relieving Factors heat, stretching, traction                         [facility] Adult PT Treatment/Exercise - 10/13/16 0001      Shoulder Exercises: Seated   External Rotation Strengthening;Both;20 reps;Theraband    Theraband Level (Shoulder External Rotation) Level 2 (Red)   Flexion Strengthening;Both;20 reps;Weights   Flexion Weight (lbs) 1   Abduction Strengthening;Both;20 reps;Weights   ABduction Weight (lbs) 1   ABduction Limitations scaption 1# 2x10   Other Seated Exercises biceps curls: red band 2x10     Shoulder Exercises: Standing   Other Standing Exercises wall push up 3x10  cues for cervical retraction and scap depression     Shoulder Exercises: ROM/Strengthening   UBE (Upper Arm Bike) Level 1 x 15 minutes (5/5)  verbal cues for posture, PT present to discuss goals     Moist Heat Therapy   Number Minutes Moist Heat 15 Minutes   Moist Heat Location Cervical  during traction     Traction   Type of Traction Cervical   Min (lbs) 5   Max (lbs) 20   Hold Time 60 seconds   Rest Time 10 seconds   Time 15 minutes                     PT Long Term Goals - 10/13/16 1405      PT LONG TERM GOAL #1   Title independent with HEP   Time 8   Period Weeks   Status On-going  PT LONG TERM GOAL #2   Title sleep on side with min. difficulty   Status Achieved     PT LONG TERM GOAL #4   Title left arm falling asleep 75% less with driving   Time 8   Period Weeks   Status On-going     PT LONG TERM GOAL #5   Title holding her 20# grandson with minimal difficulty due to pain decreased   Time 8   Period Weeks   Status On-going               Plan - 10/13/16 1406    Clinical Impression Statement Pt with good relief with traction.  Pt is focusing on postural strength and upper body strength.  Pt with continued Lt UE pain and weakness.  Pt with intermittent flare-up of Lt UE pain and muscle tension based on activity.  Pt will benefit from skilled PT for Lt UE strength, traction and manual as needed.     Rehab Potential Excellent   PT Frequency 2x / week   PT Duration 8 weeks   PT Treatment/Interventions Cryotherapy;Electrical Stimulation;Ultrasound;Traction;Moist  Heat;Therapeutic activities;Therapeutic exercise;Neuromuscular re-education;Patient/family education;Passive range of motion;Manual techniques;Dry needling   PT Next Visit Plan Traction, UE strength, postural strength   Consulted and Agree with Plan of Care Patient      Patient will benefit from skilled therapeutic intervention in order to improve the following deficits and impairments:  Pain, Decreased mobility, Decreased strength, Decreased activity tolerance, Increased muscle spasms, Decreased range of motion  Visit Diagnosis: Radiculopathy, cervical region  Other muscle spasm     Problem List Patient Active Problem List   Diagnosis Date Noted  . Normal coronary arteries 06/26/2015  . Elevated blood sugar 06/26/2015  . Family history of coronary artery disease 06/26/2015  . Diastolic dysfunction [id number]  . Atypical chest pain   . Abnormal nuclear stress test   . Chest pain 05/28/2015  . Depression 05/28/2015  . Dyslipidemia 05/28/2015  . Benign essential HTN 05/28/2015  . Normocytic anemia 05/28/2015  . GERD (gastroesophageal reflux disease) 05/28/2015  . Paresthesia 05/28/2015  . Headache   . Paresthesias      Sigurd Sos, PT 10/13/16 2:34 PM  [facility] [address] [place], [place], [address] Phone: [phone]   Fax:  [phone]  Name: Kendra Deleon MRN: [id number] Date of Birth: 1950-11-22

## 2016-10-16 ENCOUNTER — Encounter: Payer: Self-pay | Admitting: Physical Therapy

## 2016-10-16 ENCOUNTER — Ambulatory Visit: Payer: Medicare Other | Admitting: Physical Therapy

## 2016-10-16 DIAGNOSIS — M5412 Radiculopathy, cervical region: Secondary | ICD-10-CM

## 2016-10-16 DIAGNOSIS — M62838 Other muscle spasm: Secondary | ICD-10-CM | POA: Diagnosis not present

## 2016-10-16 NOTE — Therapy (Signed)
[facility] [address], [place], [place], [address] Phone: [phone]   Fax:  [phone]  Physical Therapy Treatment  Patient Details  Name: Kendra Deleon MRN: [id number] Date of Birth: Jan 29, 1951 Referring Provider: Dr. Jovita Gamma  Encounter Date: 10/16/2016      PT End of Session - 10/16/16 1103    Visit Number 22   Number of Visits 28   Date for PT Re-Evaluation 10/29/16   Authorization Type G-code 28th visit   PT Start Time 1100   PT Stop Time 1155   PT Time Calculation (min) 55 min   Activity Tolerance Patient tolerated treatment well   Behavior During Therapy [facility] for tasks assessed/performed      Past Medical History:  Diagnosis Date  . Depression   . Hyperlipidemia   . Hypertension     Past Surgical History:  Procedure Laterality Date  . achilles tendon rupture Left   . BREAST REDUCTION SURGERY    . CARDIAC CATHETERIZATION N/A 05/29/2015   Procedure: Left Heart Cath and Coronary Angiography;  Surgeon: Jettie Booze, MD;  Location: [place] [facility];  Service: Cardiovascular;  Laterality: N/A;  . OOPHORECTOMY    . right knee menisectomy  Left     There were no vitals filed for this visit.      Subjective Assessment - 10/16/16 1103    Subjective Feel like the traction helps more than anything else, and reports she saw the doctor who thinks the pain is being managed but problem is not resolving.   Pain Score 5    Pain Location Neck   Pain Orientation Left   Pain Descriptors / Indicators Sore;Aching;Spasm   Pain Type Chronic pain   Pain Radiating Towards lt arm   Pain Onset More than a month ago   Pain Frequency Intermittent   Aggravating Factors  reading, lifting, sleep   Pain Relieving Factors heat traction   Multiple Pain Sites No            [facility] PT Assessment - 10/16/16 0001      Observation/Other Assessments   Focus on Therapeutic Outcomes (FOTO)  49%  limitation                     [facility] Adult PT Treatment/Exercise - 10/16/16 0001      Shoulder Exercises: Standing   External Rotation Strengthening;Both;Theraband  30 reps   Theraband Level (Shoulder External Rotation) Level 2 (Red)     Shoulder Exercises: ROM/Strengthening   UBE (Upper Arm Bike) Level 1 x 10 minutes (5/5)     Moist Heat Therapy   Number Minutes Moist Heat 15 Minutes   Moist Heat Location Cervical     Traction   Type of Traction Cervical   Min (lbs) 5   Max (lbs) 20   Hold Time 60 seconds   Rest Time 10 seconds   Time 15 minutes     Manual Therapy   Manual Therapy Soft tissue mobilization;Joint mobilization   Manual therapy comments supine   Soft tissue mobilization suboccipitals, scalenes, uppper trap                     PT Long Term Goals - 10/16/16 1111      PT LONG TERM GOAL #1   Title independent with HEP   Time 8   Period Weeks   Status Achieved     PT LONG TERM GOAL #2  Title sleep on side with min. difficulty   Time 8   Period Weeks   Status Achieved     PT LONG TERM GOAL #3   Title walk dogs with 2 arms due to tingling in left arm decreased >/= 75%   Time 8   Period Weeks   Status Not Met     PT LONG TERM GOAL #4   Title left arm falling asleep 75% less with driving   Time 8   Period Weeks   Status Not Met     PT LONG TERM GOAL #5   Title holding her 20# grandson with minimal difficulty due to pain decreased   Baseline not lifting anything heavy   Time 8   Period Weeks   Status Not Met               Plan - 10-25-16 1153    Clinical Impression Statement Pt having good relief with PT but has plateaued with progress towards goals.  Discharge with HEP and return to doctor for other option.   PT Treatment/Interventions Cryotherapy;Electrical Stimulation;Ultrasound;Traction;Moist Heat;Therapeutic activities;Therapeutic exercise;Neuromuscular re-education;Patient/family education;Passive range  of motion;Manual techniques;Dry needling   PT Next Visit Plan discharge with HEP; return to doctor to discuss other option   Consulted and Agree with Plan of Care Patient      Patient will benefit from skilled therapeutic intervention in order to improve the following deficits and impairments:  Pain, Decreased mobility, Decreased strength, Decreased activity tolerance, Increased muscle spasms, Decreased range of motion  Visit Diagnosis: Radiculopathy, cervical region  Other muscle spasm       G-Codes - 2016-10-25 1214    Functional Assessment Tool Used FOTO: 49% limitation   Functional Limitation Changing and maintaining body position   Changing and Maintaining Body Position Current Status (B3532) At least 40 percent but less than 60 percent impaired, limited or restricted   Changing and Maintaining Body Position Goal Status (D9242) At least 20 percent but less than 40 percent impaired, limited or restricted   Changing and Maintaining Body Position Discharge Status (A8341) At least 40 percent but less than 60 percent impaired, limited or restricted      Problem List Patient Active Problem List   Diagnosis Date Noted  . Normal coronary arteries 06/26/2015  . Elevated blood sugar 06/26/2015  . Family history of coronary artery disease 06/26/2015  . Diastolic dysfunction 96/22/2979  . Atypical chest pain   . Abnormal nuclear stress test   . Chest pain 05/28/2015  . Depression 05/28/2015  . Dyslipidemia 05/28/2015  . Benign essential HTN 05/28/2015  . Normocytic anemia 05/28/2015  . GERD (gastroesophageal reflux disease) 05/28/2015  . Paresthesia 05/28/2015  . Headache   . Paresthesias     Zannie Cove , PT 10-25-16, 12:14 PM   [facility] [address] [place], [place], [address] Phone: [phone]   Fax:  [phone]  Name: Kendra Deleon MRN: [id number] Date of Birth: 09-14-1950  PHYSICAL THERAPY  DISCHARGE SUMMARY  Visits from Start of Care: 22  Current functional level related to goals / functional outcomes: See above for goals   Remaining deficits: See above note   Education / Equipment: HEP  Plan: Patient agrees to discharge.  Patient goals were partially met. Patient is being discharged due to lack of progress.  ?????

## 2016-10-20 ENCOUNTER — Encounter: Payer: BLUE CROSS/BLUE SHIELD | Admitting: Physical Therapy

## 2016-10-22 ENCOUNTER — Encounter: Payer: BLUE CROSS/BLUE SHIELD | Admitting: Physical Therapy

## 2016-10-27 ENCOUNTER — Encounter: Payer: BLUE CROSS/BLUE SHIELD | Admitting: Physical Therapy

## 2016-10-30 ENCOUNTER — Encounter: Payer: BLUE CROSS/BLUE SHIELD | Admitting: Physical Therapy

## 2016-10-31 ENCOUNTER — Other Ambulatory Visit: Payer: Self-pay | Admitting: Neurosurgery

## 2016-10-31 DIAGNOSIS — R29898 Other symptoms and signs involving the musculoskeletal system: Secondary | ICD-10-CM | POA: Diagnosis not present

## 2016-10-31 DIAGNOSIS — I10 Essential (primary) hypertension: Secondary | ICD-10-CM | POA: Diagnosis not present

## 2016-10-31 DIAGNOSIS — M5020 Other cervical disc displacement, unspecified cervical region: Secondary | ICD-10-CM | POA: Diagnosis not present

## 2016-10-31 DIAGNOSIS — Z6825 Body mass index (BMI) 25.0-25.9, adult: Secondary | ICD-10-CM | POA: Diagnosis not present

## 2016-10-31 DIAGNOSIS — M5030 Other cervical disc degeneration, unspecified cervical region: Secondary | ICD-10-CM | POA: Diagnosis not present

## 2016-10-31 DIAGNOSIS — M5412 Radiculopathy, cervical region: Secondary | ICD-10-CM | POA: Diagnosis not present

## 2016-10-31 DIAGNOSIS — M4722 Other spondylosis with radiculopathy, cervical region: Secondary | ICD-10-CM | POA: Diagnosis not present

## 2016-10-31 DIAGNOSIS — M542 Cervicalgia: Secondary | ICD-10-CM | POA: Diagnosis not present

## 2016-11-01 DIAGNOSIS — M542 Cervicalgia: Secondary | ICD-10-CM | POA: Diagnosis not present

## 2016-11-01 DIAGNOSIS — R29898 Other symptoms and signs involving the musculoskeletal system: Secondary | ICD-10-CM | POA: Diagnosis not present

## 2016-11-03 DIAGNOSIS — M5020 Other cervical disc displacement, unspecified cervical region: Secondary | ICD-10-CM | POA: Diagnosis not present

## 2016-11-03 DIAGNOSIS — Z6825 Body mass index (BMI) 25.0-25.9, adult: Secondary | ICD-10-CM | POA: Diagnosis not present

## 2016-11-03 DIAGNOSIS — I10 Essential (primary) hypertension: Secondary | ICD-10-CM | POA: Diagnosis not present

## 2016-11-04 DIAGNOSIS — E7800 Pure hypercholesterolemia, unspecified: Secondary | ICD-10-CM | POA: Diagnosis not present

## 2016-11-04 DIAGNOSIS — K219 Gastro-esophageal reflux disease without esophagitis: Secondary | ICD-10-CM | POA: Diagnosis not present

## 2016-11-04 DIAGNOSIS — N183 Chronic kidney disease, stage 3 (moderate): Secondary | ICD-10-CM | POA: Diagnosis not present

## 2016-11-04 DIAGNOSIS — F411 Generalized anxiety disorder: Secondary | ICD-10-CM | POA: Diagnosis not present

## 2016-11-04 DIAGNOSIS — Z0000 Encounter for general adult medical examination without abnormal findings: Secondary | ICD-10-CM | POA: Diagnosis not present

## 2016-11-04 DIAGNOSIS — I129 Hypertensive chronic kidney disease with stage 1 through stage 4 chronic kidney disease, or unspecified chronic kidney disease: Secondary | ICD-10-CM | POA: Diagnosis not present

## 2016-11-04 DIAGNOSIS — M5030 Other cervical disc degeneration, unspecified cervical region: Secondary | ICD-10-CM | POA: Diagnosis not present

## 2016-11-04 DIAGNOSIS — Z23 Encounter for immunization: Secondary | ICD-10-CM | POA: Diagnosis not present

## 2016-11-04 DIAGNOSIS — E559 Vitamin D deficiency, unspecified: Secondary | ICD-10-CM | POA: Diagnosis not present

## 2016-11-04 DIAGNOSIS — M899 Disorder of bone, unspecified: Secondary | ICD-10-CM | POA: Diagnosis not present

## 2016-11-10 NOTE — Pre-Procedure Instructions (Addendum)
    Kendra Deleon  11/10/2016       Your procedure is scheduled on Thursday March 15.  Report to [facility] Admitting at 11:00 A.M.                Your surgery or procedure is scheduled for 1:00 PM  Call this number if you have problems the morning of surgery: [phone]     Remember:  Do not eat food or drink liquids after midnight.  Take these medicines the morning of surgery with A SIP OF WATER: alprazolam (Xanax) if needed, fluoxetine (prozac), pantoprazole (protonix)  7 days prior to surgery STOP taking any diclofenac (voltaren)Aspirin, Aleve, Naproxen, Ibuprofen, Motrin, Advil, Goody's, BC's, all herbal medications, fish oil, and all vitamins   Do not wear jewelry, make-up or nail polish.  Do not wear lotions, powders, or perfumes, or deodorant.  Do not shave 48 hours prior to surgery.  Men may shave face and neck.  Do not bring valuables to the hospital.  [facility] is not responsible for any belongings or valuables.  Contacts, dentures or bridgework may not be worn into surgery.  Leave your suitcase in the car.  After surgery it may be brought to your room.  For patients admitted to the hospital, discharge time will be determined by your treatment team.  Patients discharged the day of surgery will not be allowed to drive home.  Special instructions: Review  [facility] - Preparing For Surgery.  Please read over the following fact sheets that you were given: [facility]- Preparing For Surgery and Patient Instructions for Mupirocin Application, Coughing and deep Breathing, Pain Booklet

## 2016-11-11 ENCOUNTER — Encounter (HOSPITAL_COMMUNITY)
Admission: RE | Admit: 2016-11-11 | Discharge: 2016-11-11 | Disposition: A | Discharge status: Home / Self Care | Payer: Medicare Other | Source: Ambulatory Visit | Attending: Neurosurgery | Admitting: Neurosurgery

## 2016-11-11 ENCOUNTER — Encounter (HOSPITAL_COMMUNITY): Payer: Self-pay

## 2016-11-11 DIAGNOSIS — M5020 Other cervical disc displacement, unspecified cervical region: Secondary | ICD-10-CM

## 2016-11-11 HISTORY — DX: Nausea with vomiting, unspecified: R11.2

## 2016-11-11 HISTORY — DX: Gastro-esophageal reflux disease without esophagitis: K21.9

## 2016-11-11 HISTORY — DX: Other specified postprocedural states: Z98.890

## 2016-11-11 HISTORY — DX: Chronic kidney disease, unspecified: N18.9

## 2016-11-11 LAB — CBC
HCT: 34.7 % — ABNORMAL LOW (ref 36.0–46.0)
Hemoglobin: 11.2 g/dL — ABNORMAL LOW (ref 12.0–15.0)
MCH: 26.3 pg (ref 26.0–34.0)
MCHC: 32.3 g/dL (ref 30.0–36.0)
MCV: 81.5 fL (ref 78.0–100.0)
PLATELETS: 309 10*3/uL (ref 150–400)
RBC: 4.26 MIL/uL (ref 3.87–5.11)
RDW: 15.1 % (ref 11.5–15.5)
WBC: 5.4 10*3/uL (ref 4.0–10.5)

## 2016-11-11 LAB — SURGICAL PCR SCREEN
MRSA, PCR: NEGATIVE
Staphylococcus aureus: NEGATIVE

## 2016-11-11 NOTE — Progress Notes (Signed)
Anesthesia Chart Review: Patient is a 66 year old female scheduled for C4-5, C5-6, C6-7 ACDF on 11/13/16 by Dr. Sherwood Gambler.  Patient requests anesthesiologist Dr. Conrad Cowgill.  History includes never smoker, post-operative N/V, HTN, HLD, depression, CKD stage III, GERD, tonsillectomy, breast reduction. Cardiac cath in 2016 showed no significant CAD.   PCP is Dr. Gaynelle Arabian. Cardiologist is Dr. Quay Burow, last visit 06/25/16.   Meds include Xanax, fenofibrate, Prozac, lisinopril-HCTZ, Protonix, Zocor.  BP (!) 143/60   Pulse 73   Temp 36.9 C   Resp 20   Ht 5\' 4"  (1.626 m)   Wt 147 lb 11.2 oz (67 kg)   SpO2 97%   BMI 25.35 kg/m    EKG 06/25/16: SB at 58 bpm.  Nuclear stress test 05/29/15:  There was no ST segment deviation noted during stress.  Defect 1: There is a small defect of mild severity present in the basal inferoseptal, basal inferior and mid inferoseptal location. There is a significant amount of extracardiac activity overlying the inferior wall as well as large pendulous breasts that make interpretation of ischemia vs. artifact difficult. There is also evidence of transient ischemic dilatation  Nuclear stress EF: 68%.  The left ventricular ejection fraction is hyperdynamic (>65%).  This is an intermediate risk study. (Cardiac cath was recommended which showed no significant CAD. See below.)  Cardiac Cath 05/29/15 (done for evaluation of chest pain with intermediate risk stress test):  No significant CAD (Right dominance; small second and third diagonal branches; 20% distal RCA).  Normal LVEDP.  No aortic stenosis.  Continue aggressive preventive therapy.  Echo 05/29/15: Study Conclusions - Left ventricle: The cavity size was normal. Wall thickness was   normal. Systolic function was normal. The estimated ejection   fraction was in the range of 55% to 60%. Wall motion was normal;   there were no regional wall motion abnormalities. Features are   consistent with a  pseudonormal left ventricular filling pattern,   with concomitant abnormal relaxation and increased filling   pressure (grade 2 diastolic dysfunction). - Aortic valve: Transvalvular velocity was within the normal range.   There was no stenosis. There was no regurgitation. - Mitral valve: There was no regurgitation. - Left atrium: The atrium was mildly dilated. - Right ventricle: The cavity size was normal. Wall thickness was   normal. Systolic function was normal. - Tricuspid valve: There was trivial regurgitation.  Carotid U/S 05/29/15: Summary: - The vertebral arteries appear patent with antegrade flow. - Findings consistent with 1-39 percent stenosis involving the   right internal carotid artery and the left internal carotid   artery.  Preoperative CBC noted. H/H 11.2/34.7. PLT 309. CMET from Dr. Andrew Au office on 11/04/16 showed glucose 83, BUN 28, Cr 1.29, NA 142, K 4.6, TBIL 0.6, ALP 32, AST 22, ALT 15.   If no acute changes then I anticipate that she can proceed as planned.  [name] Stay Center/Anesthesiology Phone [phone] 11/11/2016 11:39 AM

## 2016-11-13 ENCOUNTER — Inpatient Hospital Stay (HOSPITAL_COMMUNITY): Payer: Medicare Other | Admitting: Anesthesiology

## 2016-11-13 ENCOUNTER — Inpatient Hospital Stay (HOSPITAL_COMMUNITY)
Admission: RE | Admit: 2016-11-13 | Discharge: 2016-11-14 | DRG: 473 | Disposition: A | Discharge status: Home / Self Care | Payer: Medicare Other | Source: Ambulatory Visit | Attending: Neurosurgery | Admitting: Neurosurgery

## 2016-11-13 ENCOUNTER — Encounter (HOSPITAL_COMMUNITY)
Admission: RE | Disposition: A | Discharge status: Home / Self Care | Payer: Self-pay | Source: Ambulatory Visit | Attending: Neurosurgery

## 2016-11-13 ENCOUNTER — Inpatient Hospital Stay (HOSPITAL_COMMUNITY): Payer: Medicare Other

## 2016-11-13 ENCOUNTER — Encounter (HOSPITAL_COMMUNITY): Payer: Self-pay | Admitting: Certified Registered Nurse Anesthetist

## 2016-11-13 DIAGNOSIS — E785 Hyperlipidemia, unspecified: Secondary | ICD-10-CM | POA: Diagnosis present

## 2016-11-13 DIAGNOSIS — Z8249 Family history of ischemic heart disease and other diseases of the circulatory system: Secondary | ICD-10-CM | POA: Diagnosis not present

## 2016-11-13 DIAGNOSIS — M5020 Other cervical disc displacement, unspecified cervical region: Secondary | ICD-10-CM | POA: Diagnosis present

## 2016-11-13 DIAGNOSIS — Z419 Encounter for procedure for purposes other than remedying health state, unspecified: Secondary | ICD-10-CM

## 2016-11-13 DIAGNOSIS — M4802 Spinal stenosis, cervical region: Secondary | ICD-10-CM | POA: Diagnosis present

## 2016-11-13 DIAGNOSIS — M50223 Other cervical disc displacement at C6-C7 level: Secondary | ICD-10-CM | POA: Diagnosis not present

## 2016-11-13 DIAGNOSIS — I129 Hypertensive chronic kidney disease with stage 1 through stage 4 chronic kidney disease, or unspecified chronic kidney disease: Secondary | ICD-10-CM | POA: Diagnosis present

## 2016-11-13 DIAGNOSIS — N183 Chronic kidney disease, stage 3 (moderate): Secondary | ICD-10-CM | POA: Diagnosis present

## 2016-11-13 DIAGNOSIS — I10 Essential (primary) hypertension: Secondary | ICD-10-CM | POA: Diagnosis not present

## 2016-11-13 DIAGNOSIS — Z803 Family history of malignant neoplasm of breast: Secondary | ICD-10-CM | POA: Diagnosis not present

## 2016-11-13 DIAGNOSIS — M4722 Other spondylosis with radiculopathy, cervical region: Secondary | ICD-10-CM | POA: Diagnosis present

## 2016-11-13 DIAGNOSIS — K219 Gastro-esophageal reflux disease without esophagitis: Secondary | ICD-10-CM | POA: Diagnosis present

## 2016-11-13 DIAGNOSIS — F329 Major depressive disorder, single episode, unspecified: Secondary | ICD-10-CM | POA: Diagnosis present

## 2016-11-13 DIAGNOSIS — M50121 Cervical disc disorder at C4-C5 level with radiculopathy: Principal | ICD-10-CM | POA: Diagnosis present

## 2016-11-13 DIAGNOSIS — M4322 Fusion of spine, cervical region: Secondary | ICD-10-CM | POA: Diagnosis not present

## 2016-11-13 HISTORY — PX: ANTERIOR CERVICAL DECOMP/DISCECTOMY FUSION: SHX1161

## 2016-11-13 SURGERY — ANTERIOR CERVICAL DECOMPRESSION/DISCECTOMY FUSION 3 LEVELS
Anesthesia: General | Site: Spine Cervical

## 2016-11-13 MED ORDER — LIDOCAINE-EPINEPHRINE 2 %-1:100000 IJ SOLN
INTRAMUSCULAR | Status: DC | PRN
  Administered 2016-11-13: 5 mL

## 2016-11-13 MED ORDER — ARTIFICIAL TEARS OP OINT
TOPICAL_OINTMENT | OPHTHALMIC | Status: AC
  Filled 2016-11-13: qty 7

## 2016-11-13 MED ORDER — FENTANYL CITRATE (PF) 100 MCG/2ML IJ SOLN
INTRAMUSCULAR | Status: AC
  Filled 2016-11-13: qty 4

## 2016-11-13 MED ORDER — LACTATED RINGERS IV SOLN
INTRAVENOUS | Status: DC
  Administered 2016-11-13: 50 mL/h via INTRAVENOUS
  Administered 2016-11-13: 16:00:00 via INTRAVENOUS

## 2016-11-13 MED ORDER — FENTANYL CITRATE (PF) 100 MCG/2ML IJ SOLN
INTRAMUSCULAR | Status: AC
  Filled 2016-11-13: qty 2

## 2016-11-13 MED ORDER — LIDOCAINE-EPINEPHRINE 2 %-1:100000 IJ SOLN
INTRAMUSCULAR | Status: AC
  Filled 2016-11-13: qty 1

## 2016-11-13 MED ORDER — ACETAMINOPHEN 325 MG PO TABS: 650.0000 mg | ORAL_TABLET | ORAL | Status: DC | PRN

## 2016-11-13 MED ORDER — THROMBIN 20000 UNITS EX SOLR
CUTANEOUS | Status: AC
  Filled 2016-11-13: qty 20000

## 2016-11-13 MED ORDER — BISACODYL 10 MG RE SUPP: 10.0000 mg | Freq: Every day | RECTAL | Status: DC | PRN

## 2016-11-13 MED ORDER — CHLORHEXIDINE GLUCONATE CLOTH 2 % EX PADS: 6.0000 | MEDICATED_PAD | Freq: Once | CUTANEOUS | Status: DC

## 2016-11-13 MED ORDER — HYDROCODONE-ACETAMINOPHEN 5-325 MG PO TABS
1.0000 | ORAL_TABLET | ORAL | Status: DC | PRN
  Administered 2016-11-13 – 2016-11-14 (×4): 1 via ORAL
  Filled 2016-11-13 (×4): qty 1

## 2016-11-13 MED ORDER — FENOFIBRATE 160 MG PO TABS
160.0000 mg | ORAL_TABLET | Freq: Every day | ORAL | Status: DC
  Administered 2016-11-14: 160 mg via ORAL
  Filled 2016-11-13: qty 1

## 2016-11-13 MED ORDER — PROPOFOL 10 MG/ML IV BOLUS
INTRAVENOUS | Status: AC
  Filled 2016-11-13: qty 20

## 2016-11-13 MED ORDER — ONDANSETRON HCL 4 MG/2ML IJ SOLN
INTRAMUSCULAR | Status: AC
  Filled 2016-11-13: qty 2

## 2016-11-13 MED ORDER — THROMBIN 5000 UNITS EX SOLR
CUTANEOUS | Status: AC
  Filled 2016-11-13: qty 5000

## 2016-11-13 MED ORDER — ACETAMINOPHEN 650 MG RE SUPP: 650.0000 mg | RECTAL | Status: DC | PRN

## 2016-11-13 MED ORDER — DEXAMETHASONE SODIUM PHOSPHATE 10 MG/ML IJ SOLN
INTRAMUSCULAR | Status: DC | PRN
  Administered 2016-11-13: 10 mg via INTRAVENOUS

## 2016-11-13 MED ORDER — MORPHINE SULFATE (PF) 4 MG/ML IV SOLN: 4.0000 mg | INTRAVENOUS | Status: DC | PRN

## 2016-11-13 MED ORDER — THROMBIN 5000 UNITS EX SOLR
OROMUCOSAL | Status: DC | PRN
  Administered 2016-11-13 (×2): via TOPICAL

## 2016-11-13 MED ORDER — KETOROLAC TROMETHAMINE 15 MG/ML IJ SOLN
15.0000 mg | Freq: Once | INTRAMUSCULAR | Status: AC
  Administered 2016-11-13: 15 mg via INTRAVENOUS

## 2016-11-13 MED ORDER — ACETAMINOPHEN 10 MG/ML IV SOLN
INTRAVENOUS | Status: DC | PRN
  Administered 2016-11-13: 1000 mg via INTRAVENOUS

## 2016-11-13 MED ORDER — HYDROCHLOROTHIAZIDE 12.5 MG PO CAPS
12.5000 mg | ORAL_CAPSULE | Freq: Every day | ORAL | Status: DC
  Administered 2016-11-14: 12.5 mg via ORAL
  Filled 2016-11-13: qty 1

## 2016-11-13 MED ORDER — HYDROXYZINE HCL 25 MG PO TABS: 50.0000 mg | ORAL_TABLET | ORAL | Status: DC | PRN

## 2016-11-13 MED ORDER — MEPERIDINE HCL 25 MG/ML IJ SOLN: 6.2500 mg | INTRAMUSCULAR | Status: DC | PRN

## 2016-11-13 MED ORDER — LISINOPRIL 20 MG PO TABS
20.0000 mg | ORAL_TABLET | Freq: Every day | ORAL | Status: DC
  Administered 2016-11-14: 20 mg via ORAL
  Filled 2016-11-13: qty 1

## 2016-11-13 MED ORDER — ACETAMINOPHEN 10 MG/ML IV SOLN
INTRAVENOUS | Status: AC
  Filled 2016-11-13: qty 100

## 2016-11-13 MED ORDER — KCL IN DEXTROSE-NACL 20-5-0.45 MEQ/L-%-% IV SOLN: INTRAVENOUS | Status: DC

## 2016-11-13 MED ORDER — FLUOXETINE HCL 20 MG PO CAPS
20.0000 mg | ORAL_CAPSULE | Freq: Every day | ORAL | Status: DC
  Administered 2016-11-14: 20 mg via ORAL
  Filled 2016-11-13: qty 1

## 2016-11-13 MED ORDER — ALUM & MAG HYDROXIDE-SIMETH 200-200-20 MG/5ML PO SUSP: 30.0000 mL | Freq: Four times a day (QID) | ORAL | Status: DC | PRN

## 2016-11-13 MED ORDER — PROPOFOL 10 MG/ML IV BOLUS
INTRAVENOUS | Status: DC | PRN
  Administered 2016-11-13: 150 mg via INTRAVENOUS

## 2016-11-13 MED ORDER — MAGNESIUM HYDROXIDE 400 MG/5ML PO SUSP: 30.0000 mL | Freq: Every day | ORAL | Status: DC | PRN

## 2016-11-13 MED ORDER — ONDANSETRON HCL 4 MG/2ML IJ SOLN: 4.0000 mg | Freq: Four times a day (QID) | INTRAMUSCULAR | Status: DC | PRN

## 2016-11-13 MED ORDER — FLEET ENEMA 7-19 GM/118ML RE ENEM: 1.0000 | ENEMA | Freq: Once | RECTAL | Status: DC | PRN

## 2016-11-13 MED ORDER — MENTHOL 3 MG MT LOZG: 1.0000 | LOZENGE | OROMUCOSAL | Status: DC | PRN

## 2016-11-13 MED ORDER — PHENOL 1.4 % MT LIQD
1.0000 | OROMUCOSAL | Status: DC | PRN
  Filled 2016-11-13: qty 177

## 2016-11-13 MED ORDER — CYCLOBENZAPRINE HCL 5 MG PO TABS
5.0000 mg | ORAL_TABLET | Freq: Three times a day (TID) | ORAL | Status: DC | PRN
  Administered 2016-11-14: 5 mg via ORAL
  Filled 2016-11-13: qty 1

## 2016-11-13 MED ORDER — LIDOCAINE HCL (CARDIAC) 20 MG/ML IV SOLN
INTRAVENOUS | Status: DC | PRN
  Administered 2016-11-13: 100 mg via INTRAVENOUS

## 2016-11-13 MED ORDER — SODIUM CHLORIDE 0.9% FLUSH
3.0000 mL | Freq: Two times a day (BID) | INTRAVENOUS | Status: DC
  Administered 2016-11-13: 3 mL via INTRAVENOUS

## 2016-11-13 MED ORDER — BUPIVACAINE HCL (PF) 0.5 % IJ SOLN
INTRAMUSCULAR | Status: DC | PRN
  Administered 2016-11-13: 5 mL

## 2016-11-13 MED ORDER — PHENYLEPHRINE 40 MCG/ML (10ML) SYRINGE FOR IV PUSH (FOR BLOOD PRESSURE SUPPORT)
PREFILLED_SYRINGE | INTRAVENOUS | Status: AC
  Filled 2016-11-13: qty 20

## 2016-11-13 MED ORDER — SUGAMMADEX SODIUM 200 MG/2ML IV SOLN
INTRAVENOUS | Status: DC | PRN
  Administered 2016-11-13: 150 mg via INTRAVENOUS

## 2016-11-13 MED ORDER — DEXTROSE 5 % IV SOLN
INTRAVENOUS | Status: DC | PRN
  Administered 2016-11-13: 25 ug/min via INTRAVENOUS

## 2016-11-13 MED ORDER — KETOROLAC TROMETHAMINE 15 MG/ML IJ SOLN
INTRAMUSCULAR | Status: AC
  Administered 2016-11-13: 15 mg via INTRAVENOUS
  Filled 2016-11-13: qty 1

## 2016-11-13 MED ORDER — PANTOPRAZOLE SODIUM 40 MG PO TBEC
40.0000 mg | DELAYED_RELEASE_TABLET | Freq: Every day | ORAL | Status: DC
  Administered 2016-11-14: 40 mg via ORAL
  Filled 2016-11-13: qty 1

## 2016-11-13 MED ORDER — FENTANYL CITRATE (PF) 100 MCG/2ML IJ SOLN
INTRAMUSCULAR | Status: AC
Start: 2016-11-13 — End: 2016-11-13
  Filled 2016-11-13: qty 2

## 2016-11-13 MED ORDER — ROCURONIUM BROMIDE 50 MG/5ML IV SOSY
PREFILLED_SYRINGE | INTRAVENOUS | Status: AC
  Filled 2016-11-13: qty 5

## 2016-11-13 MED ORDER — EPHEDRINE SULFATE 50 MG/ML IJ SOLN
INTRAMUSCULAR | Status: DC | PRN
  Administered 2016-11-13: 15 mg via INTRAVENOUS
  Administered 2016-11-13: 10 mg via INTRAVENOUS
  Administered 2016-11-13: 5 mg via INTRAVENOUS
  Administered 2016-11-13: 15 mg via INTRAVENOUS

## 2016-11-13 MED ORDER — ONDANSETRON HCL 4 MG/2ML IJ SOLN: 4.0000 mg | Freq: Once | INTRAMUSCULAR | Status: DC | PRN

## 2016-11-13 MED ORDER — MIDAZOLAM HCL 2 MG/2ML IJ SOLN
INTRAMUSCULAR | Status: AC
  Filled 2016-11-13: qty 2

## 2016-11-13 MED ORDER — BUPIVACAINE HCL (PF) 0.5 % IJ SOLN
INTRAMUSCULAR | Status: AC
  Filled 2016-11-13: qty 30

## 2016-11-13 MED ORDER — SIMVASTATIN 40 MG PO TABS
40.0000 mg | ORAL_TABLET | Freq: Every day | ORAL | Status: DC
  Filled 2016-11-13: qty 1

## 2016-11-13 MED ORDER — SODIUM CHLORIDE 0.9 % IR SOLN
Status: DC | PRN
  Administered 2016-11-13: 13:00:00

## 2016-11-13 MED ORDER — ARTIFICIAL TEARS OP OINT
TOPICAL_OINTMENT | OPHTHALMIC | Status: DC | PRN
  Administered 2016-11-13: 1 via OPHTHALMIC

## 2016-11-13 MED ORDER — MIDAZOLAM HCL 5 MG/5ML IJ SOLN
INTRAMUSCULAR | Status: DC | PRN
Start: 2016-11-13 — End: 2016-11-13
  Administered 2016-11-13: 2 mg via INTRAVENOUS

## 2016-11-13 MED ORDER — HYDROXYZINE HCL 50 MG/ML IM SOLN: 50.0000 mg | INTRAMUSCULAR | Status: DC | PRN

## 2016-11-13 MED ORDER — KETOROLAC TROMETHAMINE 15 MG/ML IJ SOLN
15.0000 mg | Freq: Four times a day (QID) | INTRAMUSCULAR | Status: DC
  Administered 2016-11-13 – 2016-11-14 (×2): 15 mg via INTRAVENOUS
  Filled 2016-11-13 (×2): qty 1

## 2016-11-13 MED ORDER — LIDOCAINE 2% (20 MG/ML) 5 ML SYRINGE
INTRAMUSCULAR | Status: AC
  Filled 2016-11-13: qty 5

## 2016-11-13 MED ORDER — DEXAMETHASONE SODIUM PHOSPHATE 10 MG/ML IJ SOLN
INTRAMUSCULAR | Status: AC
  Filled 2016-11-13: qty 1

## 2016-11-13 MED ORDER — ONDANSETRON HCL 4 MG/2ML IJ SOLN
INTRAMUSCULAR | Status: DC | PRN
  Administered 2016-11-13: 4 mg via INTRAVENOUS

## 2016-11-13 MED ORDER — CEFAZOLIN SODIUM-DEXTROSE 2-4 GM/100ML-% IV SOLN
INTRAVENOUS | Status: AC
  Filled 2016-11-13: qty 100

## 2016-11-13 MED ORDER — CEFAZOLIN SODIUM-DEXTROSE 2-4 GM/100ML-% IV SOLN
2.0000 g | INTRAVENOUS | Status: AC
  Administered 2016-11-13: 2 g via INTRAVENOUS

## 2016-11-13 MED ORDER — SUGAMMADEX SODIUM 200 MG/2ML IV SOLN
INTRAVENOUS | Status: AC
  Filled 2016-11-13: qty 2

## 2016-11-13 MED ORDER — EPHEDRINE 5 MG/ML INJ
INTRAVENOUS | Status: AC
  Filled 2016-11-13: qty 10

## 2016-11-13 MED ORDER — HYDROMORPHONE HCL 1 MG/ML IJ SOLN: 0.2500 mg | INTRAMUSCULAR | Status: DC | PRN

## 2016-11-13 MED ORDER — ONDANSETRON HCL 4 MG PO TABS: 4.0000 mg | ORAL_TABLET | Freq: Four times a day (QID) | ORAL | Status: DC | PRN

## 2016-11-13 MED ORDER — 0.9 % SODIUM CHLORIDE (POUR BTL) OPTIME
TOPICAL | Status: DC | PRN
  Administered 2016-11-13: 1000 mL

## 2016-11-13 MED ORDER — LISINOPRIL-HYDROCHLOROTHIAZIDE 20-12.5 MG PO TABS: 1.0000 | ORAL_TABLET | Freq: Every day | ORAL | Status: DC

## 2016-11-13 MED ORDER — ALPRAZOLAM 0.25 MG PO TABS: 0.2500 mg | ORAL_TABLET | Freq: Two times a day (BID) | ORAL | Status: DC | PRN

## 2016-11-13 MED ORDER — THROMBIN 20000 UNITS EX SOLR
CUTANEOUS | Status: DC | PRN
  Administered 2016-11-13: 13:00:00 via TOPICAL

## 2016-11-13 MED ORDER — SODIUM CHLORIDE 0.9% FLUSH: 3.0000 mL | INTRAVENOUS | Status: DC | PRN

## 2016-11-13 MED ORDER — VITAMIN D 1000 UNITS PO TABS: 2000.0000 [IU] | ORAL_TABLET | Freq: Every day | ORAL | Status: DC

## 2016-11-13 MED ORDER — FENTANYL CITRATE (PF) 100 MCG/2ML IJ SOLN
INTRAMUSCULAR | Status: DC | PRN
  Administered 2016-11-13 (×5): 50 ug via INTRAVENOUS
  Administered 2016-11-13: 100 ug via INTRAVENOUS

## 2016-11-13 MED ORDER — ROCURONIUM BROMIDE 100 MG/10ML IV SOLN
INTRAVENOUS | Status: DC | PRN
  Administered 2016-11-13 (×3): 10 mg via INTRAVENOUS
  Administered 2016-11-13: 50 mg via INTRAVENOUS

## 2016-11-13 SURGICAL SUPPLY — 57 items
ADH SKN CLS APL DERMABOND .7 (GAUZE/BANDAGES/DRESSINGS) ×1
ALLOGRAFT CA 6X14X11 (Bone Implant) ×3 IMPLANT
BAG DECANTER FOR FLEXI CONT (MISCELLANEOUS) ×2 IMPLANT
BIT DRILL HYBRID 2.5X14 (BIT) ×1 IMPLANT
BIT DRILL NEURO 2X3.1 SFT TUCH (MISCELLANEOUS) ×1 IMPLANT
BLADE ULTRA TIP 2M (BLADE) ×1 IMPLANT
CANISTER SUCT 3000ML PPV (MISCELLANEOUS) ×2 IMPLANT
CARTRIDGE OIL MAESTRO DRILL (MISCELLANEOUS) ×1 IMPLANT
COVER MAYO STAND STRL (DRAPES) ×2 IMPLANT
DECANTER SPIKE VIAL GLASS SM (MISCELLANEOUS) ×1 IMPLANT
DERMABOND ADVANCED (GAUZE/BANDAGES/DRESSINGS) ×1
DERMABOND ADVANCED .7 DNX12 (GAUZE/BANDAGES/DRESSINGS) ×1 IMPLANT
DIFFUSER DRILL AIR PNEUMATIC (MISCELLANEOUS) ×2 IMPLANT
DRAIN CHANNEL 7F 3/4 FLAT (WOUND CARE) ×1 IMPLANT
DRAPE HALF SHEET 40X57 (DRAPES) ×1 IMPLANT
DRAPE LAPAROTOMY 100X72 PEDS (DRAPES) ×2 IMPLANT
DRAPE MICROSCOPE LEICA (MISCELLANEOUS) ×2 IMPLANT
DRAPE POUCH INSTRU U-SHP 10X18 (DRAPES) ×2 IMPLANT
DRILL NEURO 2X3.1 SOFT TOUCH (MISCELLANEOUS) ×2
ELECT COATED BLADE 2.86 ST (ELECTRODE) ×2 IMPLANT
ELECT REM PT RETURN 9FT ADLT (ELECTROSURGICAL) ×2
ELECTRODE REM PT RTRN 9FT ADLT (ELECTROSURGICAL) ×1 IMPLANT
EVACUATOR SILICONE 100CC (DRAIN) ×1 IMPLANT
GAUZE SPONGE 4X4 12PLY STRL (GAUZE/BANDAGES/DRESSINGS) ×1 IMPLANT
GLOVE BIOGEL PI IND STRL 8 (GLOVE) ×1 IMPLANT
GLOVE BIOGEL PI INDICATOR 8 (GLOVE) ×1
GLOVE ECLIPSE 7.5 STRL STRAW (GLOVE) ×2 IMPLANT
GLOVE EXAM NITRILE XL STR (GLOVE) IMPLANT
GOWN STRL REUS W/ TWL XL LVL3 (GOWN DISPOSABLE) IMPLANT
GOWN STRL REUS W/TWL 2XL LVL3 (GOWN DISPOSABLE) ×2 IMPLANT
GOWN STRL REUS W/TWL XL LVL3 (GOWN DISPOSABLE) ×4
HALTER HD/CHIN CERV TRACTION D (MISCELLANEOUS) ×2 IMPLANT
HEMOSTAT POWDER KIT SURGIFOAM (HEMOSTASIS) ×2 IMPLANT
KIT BASIN OR (CUSTOM PROCEDURE TRAY) ×2 IMPLANT
KIT ROOM TURNOVER OR (KITS) ×2 IMPLANT
NDL HYPO 25X1 1.5 SAFETY (NEEDLE) ×1 IMPLANT
NDL SPNL 22GX3.5 QUINCKE BK (NEEDLE) ×1 IMPLANT
NEEDLE HYPO 25X1 1.5 SAFETY (NEEDLE) ×2 IMPLANT
NEEDLE SPNL 22GX3.5 QUINCKE BK (NEEDLE) ×2 IMPLANT
NS IRRIG 1000ML POUR BTL (IV SOLUTION) ×2 IMPLANT
OIL CARTRIDGE MAESTRO DRILL (MISCELLANEOUS) ×2
PACK LAMINECTOMY NEURO (CUSTOM PROCEDURE TRAY) ×2 IMPLANT
PAD ARMBOARD 7.5X6 YLW CONV (MISCELLANEOUS) ×6 IMPLANT
PLATE CERVTI DYNATRAN 390X45MM (Plate) ×1 IMPLANT
RUBBERBAND STERILE (MISCELLANEOUS) ×6 IMPLANT
SCREW R HYBIRD VA 4.0X14MM (Screw) ×8 IMPLANT
SPONGE INTESTINAL PEANUT (DISPOSABLE) ×2 IMPLANT
SPONGE SURGIFOAM ABS GEL 100 (HEMOSTASIS) ×2 IMPLANT
STAPLER SKIN PROX WIDE 3.9 (STAPLE) ×1 IMPLANT
SUT VIC AB 0 CT1 18XCR BRD8 (SUTURE) IMPLANT
SUT VIC AB 0 CT1 8-18 (SUTURE)
SUT VIC AB 2-0 CP2 18 (SUTURE) ×2 IMPLANT
SUT VIC AB 3-0 SH 8-18 (SUTURE) ×2 IMPLANT
TAPE CLOTH SURG 4X10 WHT LF (GAUZE/BANDAGES/DRESSINGS) ×1 IMPLANT
TOWEL GREEN STERILE (TOWEL DISPOSABLE) ×2 IMPLANT
TOWEL GREEN STERILE FF (TOWEL DISPOSABLE) ×1 IMPLANT
WATER STERILE IRR 1000ML POUR (IV SOLUTION) ×2 IMPLANT

## 2016-11-13 NOTE — H&P (Signed)
Subjective: Patient is a 66 y.o. right handed white female who is admitted for treatment of neck and left cervical radicular pain with broad-based spondylitic disc herniations, advanced degenerative disease and spondylosis, at the C4-5 and C5-6 and C6-7 levels with bilateral neural foraminal osteophytic encroachment and stenosis at each level, and canal stenosis at C5-6 and C6-7.  They she's been treated with medications and physical therapy without relief, and in fact has developed increased neurologic deficit, now with weakness of the left biceps. She is admitted now for a three-level C4-5, C5-6, C6-7 anterior cervical decompression and arthrodesis with structural allograft and cervical plating.    Patient Active Problem List   Diagnosis Date Noted  . Normal coronary arteries 06/26/2015  . Elevated blood sugar 06/26/2015  . Family history of coronary artery disease 06/26/2015  . Diastolic dysfunction 87/56/4332  . Atypical chest pain   . Abnormal nuclear stress test   . Chest pain 05/28/2015  . Depression 05/28/2015  . Dyslipidemia 05/28/2015  . Benign essential HTN 05/28/2015  . Normocytic anemia 05/28/2015  . GERD (gastroesophageal reflux disease) 05/28/2015  . Paresthesia 05/28/2015  . Headache   . Paresthesias    Past Medical History:  Diagnosis Date  . Chronic kidney disease    stage 3 at last physical  . Depression   . GERD (gastroesophageal reflux disease)   . Hyperlipidemia   . Hypertension   . PONV (postoperative nausea and vomiting)    No problem with newwer anesthesia    Past Surgical History:  Procedure Laterality Date  . achilles tendon rupture Left    spurs  . BREAST REDUCTION SURGERY    . CARDIAC CATHETERIZATION N/A 05/29/2015   Procedure: Left Heart Cath and Coronary Angiography;  Surgeon: Jettie Booze, MD;  Location: [place] [facility];  Service: Cardiovascular;  Laterality: N/A;  . COLONOSCOPY W/ POLYPECTOMY    . OOPHORECTOMY    . right knee  menisectomy  Right   . TENNIS ELBOW RELEASE/NIRSCHEL PROCEDURE Right   . TONSILLECTOMY     age 53  . TUBAL LIGATION  1980    Prescriptions Prior to Admission  Medication Sig Dispense Refill Last Dose  . Calcium Carbonate-Vitamin D3 (CALCIUM 600-D) 600-400 MG-UNIT TABS Take 1 tablet by mouth daily.   11/12/2016 at Unknown time  . Cholecalciferol (VITAMIN D3) 2000 UNITS TABS Take 2,000 Units by mouth daily.    11/12/2016 at Unknown time  . fenofibrate 160 MG tablet Take 160 mg by mouth daily.    11/12/2016 at Unknown time  . FLUoxetine (PROZAC) 20 MG capsule Take 20 mg by mouth daily.    11/13/2016 at Unknown time  . ibuprofen (ADVIL,MOTRIN) 200 MG tablet Take 200-400 mg by mouth every 6 (six) hours as needed (for pain.).   Past Month at Unknown time  . lisinopril-hydrochlorothiazide (PRINZIDE,ZESTORETIC) 20-12.5 MG per tablet Take 1 tablet by mouth daily.    11/12/2016 at Unknown time  . pantoprazole (PROTONIX) 40 MG tablet Take 40 mg by mouth daily.    11/13/2016 at Unknown time  . simvastatin (ZOCOR) 40 MG tablet Take 40 mg by mouth at bedtime.    Past Week at Unknown time  . ALPRAZolam (XANAX) 0.25 MG tablet Take 0.25 mg by mouth 2 (two) times daily as needed for anxiety.   More than a month at Unknown time  . diclofenac (VOLTAREN) 75 MG EC tablet Take 75 mg by mouth daily.    week   Allergies  Allergen Reactions  .  No Known Allergies     Social History  Substance Use Topics  . Smoking status: Never Smoker  . Smokeless tobacco: Never Used  . Alcohol use No    Family History  Problem Relation Age of Onset  . Hypertension Mother   . Hyperlipidemia Mother   . Coronary artery disease Father     died at 73 from MI  . Diabetes Mellitus I Father     father had juvenile diabetes  . Breast cancer Sister   . Heart attack Maternal Grandfather   . Heart attack Paternal Grandfather      Review of Systems A comprehensive review of systems was negative.  Objective: Vital signs in last 24  hours: Temp:  [98.1 F (36.7 C)] 98.1 F (36.7 C) (03/15 1043) Pulse Rate:  [56] 56 (03/15 1043) Resp:  [20] 20 (03/15 1043) BP: (130)/(78) 130/78 (03/15 1043) SpO2:  [100 %] 100 % (03/15 1043) Weight:  [66.7 kg (147 lb)] 66.7 kg (147 lb) (03/15 1043)  EXAM: She is well-developed well-nourished white female in no acute distress. Lungs are clear to auscultation , the patient has symmetrical respiratory excursion. Heart has a regular rate and rhythm normal S1 and S2 no murmur.   Abdomen is soft nontender nondistended bowel sounds are present. Extremity examination shows no clubbing cyanosis or edema. Neurologic examination shows weakness of the left biceps 4 minus to 4/5, the remainder of the upper extremity strength is 5/5 including the deltoid laterally, the right eye sips, and the triceps, intrinsics, and grip bilaterally. Sensation intact to pinprick the distal upper extremities. Reflexes are symmetrical in the upper and lower extremities, there are no pathologic reflexes. Toes are downgoing bilaterally. She has a normal gait and stance.  Data Review:CBC    Component Value Date/Time   WBC 5.4 11/11/2016 0936   RBC 4.26 11/11/2016 0936   HGB 11.2 (L) 11/11/2016 0936   HCT 34.7 (L) 11/11/2016 0936   PLT 309 11/11/2016 0936   MCV 81.5 11/11/2016 0936   MCH 26.3 11/11/2016 0936   MCHC 32.3 11/11/2016 0936   RDW 15.1 11/11/2016 0936                          BMET    Component Value Date/Time   NA 142 05/28/2015 0941   K 3.9 05/28/2015 0941   CL 107 05/28/2015 0941   CO2 27 05/28/2015 0941   GLUCOSE 99 05/28/2015 0941   BUN 19 05/28/2015 0941   CREATININE 1.00 05/28/2015 0941   CALCIUM 9.2 05/28/2015 0941   GFRNONAA 58 (L) 05/28/2015 0941   GFRAA >60 05/28/2015 0941     Assessment/Plan: Patient with advanced degeneration at the C4-5, C5-C6, and C6-7 levels with neck pain and left cervical radicular pain, with increasing weakness in the left biceps who is admitted now for 3  level C4-5, C5-C6, and C6-7 ACDF.  I've discussed with the patient the nature of his condition, the nature the surgical procedure, the typical length of surgery, hospital stay, and overall recuperation. We discussed limitations postoperatively. I discussed risks of surgery including risks of infection, bleeding, possibly need for transfusion, the risk of nerve root dysfunction with pain, weakness, numbness, or paresthesias, the risk of spinal cord dysfunction with paralysis of all 4 limbs and quadriplegia, and the risk of dural tear and CSF leakage and possible need for further surgery, the risk of esophageal dysfunction causing dysphagia and the risk of laryngeal dysfunction causing hoarseness  of the voice, the risk of failure of the arthrodesis and the possible need for further surgery, and the risk of anesthetic complications including myocardial infarction, stroke, pneumonia, and death. We also discussed the need for postoperative immobilization in a cervical collar. Understanding all this the patient does wish to proceed with surgery and is admitted for such.    Hosie Spangle, MD 11/13/2016 12:01 PM

## 2016-11-13 NOTE — Anesthesia Postprocedure Evaluation (Signed)
Anesthesia Post Note  Patient: Kendra Deleon  Procedure(s) Performed: Procedure(s) (LRB): CERVICAL FOUR-FIVE, CERVICAL FIVE-SIX, CERVICAL SIX-SEVEN ANTERIOR CERVICAL DECOMPRESSION/DISCECTOMY FUSION (N/A)  Patient location during evaluation: PACU Anesthesia Type: General Level of consciousness: awake and alert Pain management: pain level controlled Vital Signs Assessment: post-procedure vital signs reviewed and stable Respiratory status: spontaneous breathing, nonlabored ventilation, respiratory function stable and patient connected to nasal cannula oxygen Cardiovascular status: blood pressure returned to baseline and stable Postop Assessment: no signs of nausea or vomiting Anesthetic complications: no       Last Vitals:  Vitals:   11/13/16 1730 11/13/16 1745  BP: 137/78 139/72  Pulse: 89 86  Resp: 20 15  Temp:  36.4 C    Last Pain:  Vitals:   11/13/16 1745  TempSrc:   PainSc: Numa DAVID

## 2016-11-13 NOTE — Anesthesia Procedure Notes (Signed)
Procedure Name: Intubation Date/Time: 11/13/2016 1:03 PM Performed by: Candis Shine Pre-anesthesia Checklist: Patient identified, Emergency Drugs available, Suction available and Patient being monitored Patient Re-evaluated:Patient Re-evaluated prior to inductionOxygen Delivery Method: Circle System Utilized Preoxygenation: Pre-oxygenation with 100% oxygen Intubation Type: IV induction Ventilation: Mask ventilation without difficulty Laryngoscope Size: Glidescope and 4 Grade View: Grade I Tube type: Oral Tube size: 7.0 mm Number of attempts: 1 Airway Equipment and Method: Stylet and Video-laryngoscopy Placement Confirmation: ETT inserted through vocal cords under direct vision,  positive ETCO2 and breath sounds checked- equal and bilateral Secured at: 22 cm Tube secured with: Tape Dental Injury: Teeth and Oropharynx as per pre-operative assessment  Difficulty Due To: Difficult Airway- due to reduced neck mobility Comments: Elective glidescope intubation d/t reduced neck mobility

## 2016-11-13 NOTE — Anesthesia Preprocedure Evaluation (Signed)
Anesthesia Evaluation  Patient identified by MRN, date of birth, ID band Patient awake    Reviewed: Allergy & Precautions, NPO status , Patient's Chart, lab work & pertinent test results  Airway Mallampati: I  TM Distance: >3 FB Neck ROM: Full    Dental   Pulmonary    Pulmonary exam normal        Cardiovascular hypertension, Pt. on medications Normal cardiovascular exam     Neuro/Psych Depression    GI/Hepatic GERD  Medicated and Controlled,  Endo/Other    Renal/GU      Musculoskeletal   Abdominal   Peds  Hematology   Anesthesia Other Findings   Reproductive/Obstetrics                             Anesthesia Physical Anesthesia Plan  ASA: II  Anesthesia Plan: General   Post-op Pain Management:    Induction: Intravenous  Airway Management Planned: Oral ETT  Additional Equipment:   Intra-op Plan:   Post-operative Plan: Extubation in OR  Informed Consent: I have reviewed the patients History and Physical, chart, labs and discussed the procedure including the risks, benefits and alternatives for the proposed anesthesia with the patient or authorized representative who has indicated his/her understanding and acceptance.     Plan Discussed with: CRNA and Surgeon  Anesthesia Plan Comments:         Anesthesia Quick Evaluation

## 2016-11-13 NOTE — Op Note (Signed)
11/13/2016  4:28 PM  PATIENT:  Hayden Rasmussen  66 y.o. female  PRE-OPERATIVE DIAGNOSIS:  C4-5, C5-C6, and C6-7 Herniated nucleus pulposus, cervical; cervical spondylosis; cervical degenerative disease; cervical radiculopathy  POST-OPERATIVE DIAGNOSIS:  C4-5, C5-C6, and C6-7 Herniated nucleus pulposus, cervical; cervical spondylosis; cervical degenerative disease; cervical radiculopathy  PROCEDURE:  Procedure(s):  CERVICAL FOUR-FIVE, CERVICAL FIVE-SIX, CERVICAL SIX-SEVEN ANTERIOR CERVICAL DECOMPRESSION/DISCECTOMY & ARTHRODESIS WITH DYNATRAN CERVICAL PLATING AND STRUCTURAL ALLOGRAFT  SURGEON:  Surgeon(s): Jovita Gamma, MD Eustace Moore, MD  ASSISTANTS: Sherley Bounds, M.D.  ANESTHESIA:   general  EBL:  Total I/O In: 1000 [I.V.:1000] Out: 475 [Urine:450; Blood:25]  BLOOD ADMINISTERED:none  COUNT: Correct per nursing staff  DRAINS:  7 mm Jackson-Pratt drain in the prevertebral space  DICTATION: Patient was brought to the operating room placed under general endotracheal anesthesia. Patient was placed in 10 pounds of halter traction. The neck was prepped with Betadine soap and solution and draped in a sterile fashion. A obliquel incision was made on the left side of the neck paralleling the anterior border of the sternocleidomastoid.. The line of the incision was infiltrated with local anesthetic with epinephrine. Dissection was carried down thru the subcutaneous tissue and platysma, bipolar cautery was used to maintain hemostasis. Dissection was then carried out thru an avascular plane leaving the sternocleidomastoid carotid artery and jugular vein laterally and the trachea and esophagus medially. The ventral aspect of the vertebral column was identified and a localizing x-ray was taken. The C4-5, C5-6, C6-7 levels were identified. The annulus at each level was incised and the disc space entered. Anterior osteophytic overgrowth was removed using the osteophyte removal tool, the high-speed  drill, and Kerrison punches. Discectomy was performed with micro-curettes and pituitary rongeurs. The operating microscope was draped and brought into the field provided additional magnification illumination and visualization. Discectomy was continued posteriorly thru the disc space and then the cartilaginous endplate was removed using micro-curettes along with the high-speed drill. Posterior osteophytic overgrowth was removed at each level using the high-speed drill along with a 2 mm thin footplated Kerrison punch. Posterior longitudinal ligament along with disc herniation was carefully removed, decompressing the spinal canal and thecal sac. We then continued to remove osteophytic overgrowth and disc material decompressing the neural foramina and exiting nerve roots bilaterally. Once the decompression was completed hemostasis was established at each level with the use of Gelfoam with thrombin and bipolar cautery. The Gelfoam was removed, a thin layer Surgifoam applied, the wound irrigated and hemostasis confirmed. We then measured the height of each intravertebral disc space level and selected a 6 millimeter in height structural allograft for the C4-5 level, a 6 millimeter in height structural allograft for the C5-6 level, and a 6 millimeter in height structural allograft for the C6-7 level . Each was hydrated in saline solution and then gently positioned in the intravertebral disc space and countersunk. We then selected a 45 mm millimeter in height Dynatran cervical plate. It was positioned over the fusion construct and secured to the vertebra with eight 4 x 14 mm self-tapping variable screws. Each screw hole was made with the hand drill, and then the screws placed, once all the screws were placed, the locking system was secured. The wound was irrigated with bacitracin solution checked for hemostasis which was established and confirmed.  Because of the extent of the exposure, we placed a 7 mm Jackson-Pratt drain  in the prevertebral space that was brought out through a separate stab incision. An x-ray was taken which  showed grafts in good position, plate and screws in good position, and the overall construct looked good.. We then proceeded with closure. The platysma was closed with interrupted inverted 2-0 undyed Vicryl suture, the subcutaneous and subcuticular closed with interrupted inverted 3-0 undyed Vicryl suture. The skin edges were approximated with Dermabond. The drain was sutured to the skin with a 3-0 nylon suture. Following surgery the patient was taken out of cervical traction. To be reversed and the anesthetic and taken to the recovery room for further care.  PLAN OF CARE: Admit to inpatient   PATIENT DISPOSITION:  PACU - hemodynamically stable.   Delay start of Pharmacological VTE agent (>24hrs) due to surgical blood loss or risk of bleeding:  yes

## 2016-11-13 NOTE — Progress Notes (Signed)
Vitals:   11/13/16 1645 11/13/16 1700 11/13/16 1715 11/13/16 1730  BP: (!) 150/73 140/73 (!) 144/72 137/78  Pulse: 98 90 89 89  Resp: 19 16 10 20   Temp: 97.3 F (36.3 C)     TempSrc:      SpO2: 97% 97% 91% 95%  Weight:        CBC  Recent Labs  11/11/16 0936  WBC 5.4  HGB 11.2*  HCT 34.7*  PLT 309    Patient resting comfortably in recovery room. Moving all 4 extremities to command. Incision clean and dry, minimal drainage into the Jackson-Pratt drain. Foley remains to straight drainage, we'll plan on having nursing staff removed once she has ambulated up on the floor.  Plan: Doing well following surgery. We'll progress through postoperative recovery.  Hosie Spangle, MD 11/13/2016, 5:42 PM

## 2016-11-13 NOTE — Transfer of Care (Signed)
Immediate Anesthesia Transfer of Care Note  Patient: Kendra Deleon  Procedure(s) Performed: Procedure(s): CERVICAL FOUR-FIVE, CERVICAL FIVE-SIX, CERVICAL SIX-SEVEN ANTERIOR CERVICAL DECOMPRESSION/DISCECTOMY FUSION (N/A)  Patient Location: PACU  Anesthesia Type:General  Level of Consciousness: awake, alert  and oriented  Airway & Oxygen Therapy: Patient Spontanous Breathing and Patient connected to nasal cannula oxygen  Post-op Assessment: Report given to RN and Post -op Vital signs reviewed and stable  Post vital signs: Reviewed and stable  Last Vitals:  Vitals:   11/13/16 1043  BP: 130/78  Pulse: (!) 56  Resp: 20  Temp: 36.7 C    Last Pain:  Vitals:   11/13/16 1111  TempSrc:   PainSc: 10-Worst pain ever      Patients Stated Pain Goal: 2 (88/87/57 9728)  Complications: No apparent anesthesia complications

## 2016-11-14 MED ORDER — HYDROCODONE-ACETAMINOPHEN 5-325 MG PO TABS: 1.0000 | ORAL_TABLET | ORAL | 0 refills | Status: DC | PRN

## 2016-11-14 MED FILL — Thrombin For Soln 5000 Unit: CUTANEOUS | Qty: 5000 | Status: AC

## 2016-11-14 NOTE — Discharge Instructions (Signed)

## 2016-11-14 NOTE — Discharge Summary (Signed)
Physician Discharge Summary  Patient ID: Kendra Deleon MRN: [id number] DOB/AGE: October 20, 1950 66 y.o.  Admit date: 11/13/2016 Discharge date: 11/14/2016  Admission Diagnoses:  C4-5, C5-C6, and C6-7 Herniated nucleus pulposus, cervical; cervical spondylosis; cervical degenerative disease; cervical radiculopathy  Discharge Diagnoses:  C4-5, C5-C6, and C6-7 Herniated nucleus pulposus, cervical; cervical spondylosis; cervical degenerative disease; cervical radiculopathy Active Problems:   HNP (herniated nucleus pulposus), cervical   Discharged Condition: good  Hospital Course: Patient was admitted, underwent a 3 level C4-5, C5-6, C6-7 ACDF with structural allografting cervical plating. Postoperative she is done well. Her Foley was removed, and she's been voiding well. She is up and ambulating actively. Her incision is healing nicely. We did remove her Jackson-Pratt drain. She's been given instructions regarding wound care and activities following discharge. She is scheduled to follow-up with me in 3 weeks in the office.  Discharge Exam: Blood pressure 126/66, pulse 68, temperature 98.2 F (36.8 C), resp. rate 18, weight 66.7 kg (147 lb), SpO2 99 %.  Disposition: 01-Home or Self Care  Discharge Instructions    Discharge wound care:    Complete by:  As directed    Leave the wound open to air. Shower daily with the wound uncovered. Water and soapy water should run over the incision area. Do not wash directly on the incision for 2 weeks. Remove the glue after 2 weeks.   Driving Restrictions    Complete by:  As directed    No driving for 2 weeks. May ride in the car locally now. May begin to drive locally in 2 weeks.   Other Restrictions    Complete by:  As directed    Walk gradually increasing distances out in the fresh air at least twice a day. Walking additional 6 times inside the house, gradually increasing distances, daily. No bending, lifting, or twisting. Perform activities between  shoulder and waist height (that is at counter height when standing or table height when sitting).     Allergies as of 11/14/2016      Reactions   No Known Allergies       Medication List    TAKE these medications   ALPRAZolam 0.25 MG tablet Commonly known as:  XANAX Take 0.25 mg by mouth 2 (two) times daily as needed for anxiety.   CALCIUM 600-D 600-400 MG-UNIT Tabs Generic drug:  Calcium Carbonate-Vitamin D3 Take 1 tablet by mouth daily.   diclofenac 75 MG EC tablet Commonly known as:  VOLTAREN Take 75 mg by mouth daily.   fenofibrate 160 MG tablet Take 160 mg by mouth daily.   FLUoxetine 20 MG capsule Commonly known as:  PROZAC Take 20 mg by mouth daily.   HYDROcodone-acetaminophen 5-325 MG tablet Commonly known as:  NORCO/VICODIN Take 1-2 tablets by mouth every 4 (four) hours as needed for moderate pain.   ibuprofen 200 MG tablet Commonly known as:  ADVIL,MOTRIN Take 200-400 mg by mouth every 6 (six) hours as needed (for pain.).   lisinopril-hydrochlorothiazide 20-12.5 MG tablet Commonly known as:  PRINZIDE,ZESTORETIC Take 1 tablet by mouth daily.   pantoprazole 40 MG tablet Commonly known as:  PROTONIX Take 40 mg by mouth daily.   simvastatin 40 MG tablet Commonly known as:  ZOCOR Take 40 mg by mouth at bedtime.   Vitamin D3 2000 units Tabs Take 2,000 Units by mouth daily.        SignedHosie Spangle 11/14/2016, 9:05 AM

## 2016-11-14 NOTE — Progress Notes (Signed)
Patient alert and oriented, mae's well, voiding adequate amount of urine, swallowing without difficulty, no c/o pain. Patient discharged home with family. Script and discharged instructions given to patient. Patient and family stated understanding of d/c instructions given and has an appointment with Dr..Nudelman.

## 2016-11-18 ENCOUNTER — Encounter (HOSPITAL_COMMUNITY): Payer: Self-pay | Admitting: Neurosurgery

## 2016-12-05 DIAGNOSIS — Z981 Arthrodesis status: Secondary | ICD-10-CM | POA: Diagnosis not present

## 2016-12-05 DIAGNOSIS — M4722 Other spondylosis with radiculopathy, cervical region: Secondary | ICD-10-CM | POA: Diagnosis not present

## 2016-12-05 DIAGNOSIS — M5030 Other cervical disc degeneration, unspecified cervical region: Secondary | ICD-10-CM | POA: Diagnosis not present

## 2016-12-05 DIAGNOSIS — M5020 Other cervical disc displacement, unspecified cervical region: Secondary | ICD-10-CM | POA: Diagnosis not present

## 2017-01-12 DIAGNOSIS — N183 Chronic kidney disease, stage 3 (moderate): Secondary | ICD-10-CM | POA: Diagnosis not present

## 2017-02-06 DIAGNOSIS — N183 Chronic kidney disease, stage 3 (moderate): Secondary | ICD-10-CM | POA: Diagnosis not present

## 2017-02-06 DIAGNOSIS — I129 Hypertensive chronic kidney disease with stage 1 through stage 4 chronic kidney disease, or unspecified chronic kidney disease: Secondary | ICD-10-CM | POA: Diagnosis not present

## 2017-02-10 DIAGNOSIS — Z6825 Body mass index (BMI) 25.0-25.9, adult: Secondary | ICD-10-CM | POA: Diagnosis not present

## 2017-02-10 DIAGNOSIS — M5030 Other cervical disc degeneration, unspecified cervical region: Secondary | ICD-10-CM | POA: Diagnosis not present

## 2017-02-10 DIAGNOSIS — M542 Cervicalgia: Secondary | ICD-10-CM | POA: Diagnosis not present

## 2017-02-10 DIAGNOSIS — Z981 Arthrodesis status: Secondary | ICD-10-CM | POA: Diagnosis not present

## 2017-02-10 DIAGNOSIS — I10 Essential (primary) hypertension: Secondary | ICD-10-CM | POA: Diagnosis not present

## 2017-03-03 DIAGNOSIS — H2513 Age-related nuclear cataract, bilateral: Secondary | ICD-10-CM | POA: Diagnosis not present

## 2017-05-13 DIAGNOSIS — Z1231 Encounter for screening mammogram for malignant neoplasm of breast: Secondary | ICD-10-CM | POA: Diagnosis not present

## 2017-05-13 DIAGNOSIS — Z803 Family history of malignant neoplasm of breast: Secondary | ICD-10-CM | POA: Diagnosis not present

## 2017-06-02 DIAGNOSIS — Z23 Encounter for immunization: Secondary | ICD-10-CM | POA: Diagnosis not present

## 2017-06-02 DIAGNOSIS — D229 Melanocytic nevi, unspecified: Secondary | ICD-10-CM | POA: Diagnosis not present

## 2017-06-02 DIAGNOSIS — L728 Other follicular cysts of the skin and subcutaneous tissue: Secondary | ICD-10-CM | POA: Diagnosis not present

## 2017-06-02 DIAGNOSIS — Z1289 Encounter for screening for malignant neoplasm of other sites: Secondary | ICD-10-CM | POA: Diagnosis not present

## 2017-06-19 DIAGNOSIS — H53461 Homonymous bilateral field defects, right side: Secondary | ICD-10-CM | POA: Diagnosis not present

## 2017-06-24 ENCOUNTER — Encounter (INDEPENDENT_AMBULATORY_CARE_PROVIDER_SITE_OTHER): Payer: Self-pay

## 2017-06-24 ENCOUNTER — Ambulatory Visit (INDEPENDENT_AMBULATORY_CARE_PROVIDER_SITE_OTHER): Payer: Medicare Other | Admitting: Neurology

## 2017-06-24 ENCOUNTER — Encounter: Payer: Self-pay | Admitting: Neurology

## 2017-06-24 VITALS — BP 150/88 | HR 68 | Ht 63.0 in | Wt 141.8 lb

## 2017-06-24 DIAGNOSIS — H53462 Homonymous bilateral field defects, left side: Secondary | ICD-10-CM | POA: Diagnosis not present

## 2017-06-24 MED ORDER — ASPIRIN EC 325 MG PO TBEC: 325.0000 mg | DELAYED_RELEASE_TABLET | Freq: Every day | ORAL | 0 refills | Status: DC
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
## 2017-06-24 NOTE — Patient Instructions (Signed)
I had a long d/w patient and her husband about her recent left sided peripheral vision loss likely from a right posterior cerebral artery  stroke, risk for recurrent stroke/TIAs, personally independently reviewed imaging studies and stroke evaluation results and answered questions.Continue   aspirin 325 mg daily  for secondary stroke prevention and maintain strict control of hypertension with blood pressure goal below 130/90, diabetes with hemoglobin A1c goal below 6.5% and lipids with LDL cholesterol goal below 70 mg/dL. I also advised the patient to eat a healthy diet with plenty of whole grains, cereals, fruits and vegetables, exercise regularly and maintain ideal body weight ./ have advised the patient not to drive. Refer to outpatient occupational and physical therapy.Followup in the future with me in 2 months or call earlier if necessary  Stroke Prevention Some medical conditions and behaviors are associated with an increased chance of having a stroke. You may prevent a stroke by making healthy choices and managing medical conditions. How can I reduce my risk of having a stroke?  Stay physically active. Get at least 30 minutes of activity on most or all days.  Do not smoke. It may also be helpful to avoid exposure to secondhand smoke.  Limit alcohol use. Moderate alcohol use is considered to be: ? No more than 2 drinks per day for men. ? No more than 1 drink per day for nonpregnant women.  Eat healthy foods. This involves: ? Eating 5 or more servings of fruits and vegetables a day. ? Making dietary changes that address high blood pressure (hypertension), high cholesterol, diabetes, or obesity.  Manage your cholesterol levels. ? Making food choices that are high in fiber and low in saturated fat, trans fat, and cholesterol may control cholesterol levels. ? Take any prescribed medicines to control cholesterol as directed by your health care provider.  Manage your diabetes. ? Controlling  your carbohydrate and sugar intake is recommended to manage diabetes. ? Take any prescribed medicines to control diabetes as directed by your health care provider.  Control your hypertension. ? Making food choices that are low in salt (sodium), saturated fat, trans fat, and cholesterol is recommended to manage hypertension. ? Ask your health care provider if you need treatment to lower your blood pressure. Take any prescribed medicines to control hypertension as directed by your health care provider. ? If you are 34-103 years of age, have your blood pressure checked every 3-5 years. If you are 17 years of age or older, have your blood pressure checked every year.  Maintain a healthy weight. ? Reducing calorie intake and making food choices that are low in sodium, saturated fat, trans fat, and cholesterol are recommended to manage weight.  Stop drug abuse.  Avoid taking birth control pills. ? Talk to your health care provider about the risks of taking birth control pills if you are over 70 years old, smoke, get migraines, or have ever had a blood clot.  Get evaluated for sleep disorders (sleep apnea). ? Talk to your health care provider about getting a sleep evaluation if you snore a lot or have excessive sleepiness.  Take medicines only as directed by your health care provider. ? For some people, aspirin or blood thinners (anticoagulants) are helpful in reducing the risk of forming abnormal blood clots that can lead to stroke. If you have the irregular heart rhythm of atrial fibrillation, you should be on a blood thinner unless there is a good reason you cannot take them. ? Understand all your medicine  instructions.  Make sure that other conditions (such as anemia or atherosclerosis) are addressed. Get help right away if:  You have sudden weakness or numbness of the face, arm, or leg, especially on one side of the body.  Your face or eyelid droops to one side.  You have sudden  confusion.  You have trouble speaking (aphasia) or understanding.  You have sudden trouble seeing in one or both eyes.  You have sudden trouble walking.  You have dizziness.  You have a loss of balance or coordination.  You have a sudden, severe headache with no known cause.  You have new chest pain or an irregular heartbeat. Any of these symptoms may represent a serious problem that is an emergency. Do not wait to see if the symptoms will go away. Get medical help at once. Call your local emergency services (911 in [place] Do not drive yourself to the hospital. This information is not intended to replace advice given to you by your health care provider. Make sure you discuss any questions you have with your health care provider. Document Released: 09/25/2004 Document Revised: 01/24/2016 Document Reviewed: 02/18/2013 Elsevier Interactive Patient Education  2017 [employer].

## 2017-06-26 DIAGNOSIS — Z981 Arthrodesis status: Secondary | ICD-10-CM | POA: Diagnosis not present

## 2017-06-26 DIAGNOSIS — M5030 Other cervical disc degeneration, unspecified cervical region: Secondary | ICD-10-CM | POA: Diagnosis not present

## 2017-06-26 DIAGNOSIS — G5601 Carpal tunnel syndrome, right upper limb: Secondary | ICD-10-CM | POA: Diagnosis not present

## 2017-06-26 DIAGNOSIS — M4722 Other spondylosis with radiculopathy, cervical region: Secondary | ICD-10-CM | POA: Diagnosis not present

## 2017-06-26 LAB — LIPID PANEL
CHOLESTEROL TOTAL: 180 mg/dL (ref 100–199)
Chol/HDL Ratio: 2.8 ratio (ref 0.0–4.4)
HDL: 65 mg/dL (ref >39)
LDL Calculated: 97 mg/dL (ref 0–99)
Triglycerides: 89 mg/dL (ref 0–149)
VLDL CHOLESTEROL CAL: 18 mg/dL (ref 5–40)

## 2017-06-26 LAB — HEMOGLOBIN A1C
ESTIMATED AVERAGE GLUCOSE: 126 mg/dL
Hgb A1c MFr Bld: 6 % — ABNORMAL HIGH (ref 4.8–5.6)

## 2017-07-03 ENCOUNTER — Ambulatory Visit (HOSPITAL_COMMUNITY)
Admission: RE | Admit: 2017-07-03 | Discharge: 2017-07-03 | Disposition: A | Discharge status: Home / Self Care | Payer: Medicare Other | Source: Ambulatory Visit | Attending: Neurology | Admitting: Neurology

## 2017-07-03 DIAGNOSIS — I071 Rheumatic tricuspid insufficiency: Secondary | ICD-10-CM | POA: Insufficient documentation

## 2017-07-03 DIAGNOSIS — H53462 Homonymous bilateral field defects, left side: Secondary | ICD-10-CM | POA: Diagnosis present

## 2017-07-03 NOTE — Progress Notes (Signed)
  Echocardiogram 2D Echocardiogram has been performed.  Jennette Dubin 07/03/2017, 9:45 AM

## 2017-07-05 ENCOUNTER — Observation Stay (HOSPITAL_COMMUNITY)
Admission: EM | Admit: 2017-07-05 | Discharge: 2017-07-06 | Disposition: A | Discharge status: Home / Self Care | Payer: Medicare Other | Attending: Internal Medicine | Admitting: Internal Medicine

## 2017-07-05 ENCOUNTER — Ambulatory Visit
Admission: RE | Admit: 2017-07-05 | Discharge: 2017-07-05 | Disposition: A | Discharge status: Home / Self Care | Payer: Medicare Other | Source: Ambulatory Visit | Attending: Neurology | Admitting: Neurology

## 2017-07-05 ENCOUNTER — Telehealth: Payer: Self-pay | Admitting: Neurology

## 2017-07-05 ENCOUNTER — Other Ambulatory Visit: Payer: Self-pay

## 2017-07-05 ENCOUNTER — Encounter (HOSPITAL_COMMUNITY): Payer: Self-pay | Admitting: Emergency Medicine

## 2017-07-05 DIAGNOSIS — Z23 Encounter for immunization: Secondary | ICD-10-CM | POA: Insufficient documentation

## 2017-07-05 DIAGNOSIS — H53462 Homonymous bilateral field defects, left side: Secondary | ICD-10-CM

## 2017-07-05 DIAGNOSIS — K219 Gastro-esophageal reflux disease without esophagitis: Secondary | ICD-10-CM | POA: Diagnosis not present

## 2017-07-05 DIAGNOSIS — Z7982 Long term (current) use of aspirin: Secondary | ICD-10-CM | POA: Diagnosis not present

## 2017-07-05 DIAGNOSIS — I6523 Occlusion and stenosis of bilateral carotid arteries: Secondary | ICD-10-CM | POA: Diagnosis not present

## 2017-07-05 DIAGNOSIS — Z8249 Family history of ischemic heart disease and other diseases of the circulatory system: Secondary | ICD-10-CM | POA: Insufficient documentation

## 2017-07-05 DIAGNOSIS — N183 Chronic kidney disease, stage 3 (moderate): Secondary | ICD-10-CM | POA: Insufficient documentation

## 2017-07-05 DIAGNOSIS — E559 Vitamin D deficiency, unspecified: Secondary | ICD-10-CM | POA: Diagnosis not present

## 2017-07-05 DIAGNOSIS — E785 Hyperlipidemia, unspecified: Secondary | ICD-10-CM | POA: Insufficient documentation

## 2017-07-05 DIAGNOSIS — I129 Hypertensive chronic kidney disease with stage 1 through stage 4 chronic kidney disease, or unspecified chronic kidney disease: Secondary | ICD-10-CM | POA: Insufficient documentation

## 2017-07-05 DIAGNOSIS — F329 Major depressive disorder, single episode, unspecified: Secondary | ICD-10-CM | POA: Insufficient documentation

## 2017-07-05 DIAGNOSIS — I619 Nontraumatic intracerebral hemorrhage, unspecified: Secondary | ICD-10-CM | POA: Diagnosis not present

## 2017-07-05 DIAGNOSIS — Z79899 Other long term (current) drug therapy: Secondary | ICD-10-CM | POA: Diagnosis not present

## 2017-07-05 DIAGNOSIS — I10 Essential (primary) hypertension: Secondary | ICD-10-CM | POA: Diagnosis not present

## 2017-07-05 DIAGNOSIS — G43909 Migraine, unspecified, not intractable, without status migrainosus: Secondary | ICD-10-CM | POA: Diagnosis not present

## 2017-07-05 DIAGNOSIS — R51 Headache: Secondary | ICD-10-CM | POA: Diagnosis not present

## 2017-07-05 DIAGNOSIS — I639 Cerebral infarction, unspecified: Secondary | ICD-10-CM | POA: Diagnosis present

## 2017-07-05 DIAGNOSIS — I6521 Occlusion and stenosis of right carotid artery: Secondary | ICD-10-CM | POA: Diagnosis not present

## 2017-07-05 DIAGNOSIS — I63431 Cerebral infarction due to embolism of right posterior cerebral artery: Secondary | ICD-10-CM | POA: Diagnosis not present

## 2017-07-05 LAB — CBC
HCT: 39.2 % (ref 36.0–46.0)
HEMOGLOBIN: 12.2 g/dL (ref 12.0–15.0)
MCH: 24.1 pg — AB (ref 26.0–34.0)
MCHC: 31.1 g/dL (ref 30.0–36.0)
MCV: 77.3 fL — AB (ref 78.0–100.0)
PLATELETS: 352 10*3/uL (ref 150–400)
RBC: 5.07 MIL/uL (ref 3.87–5.11)
RDW: 16.6 % — ABNORMAL HIGH (ref 11.5–15.5)
WBC: 7.6 10*3/uL (ref 4.0–10.5)

## 2017-07-05 LAB — COMPREHENSIVE METABOLIC PANEL
ALBUMIN: 4.4 g/dL (ref 3.5–5.0)
ALK PHOS: 54 U/L (ref 38–126)
ALT: 16 U/L (ref 14–54)
ANION GAP: 8 (ref 5–15)
AST: 26 U/L (ref 15–41)
BILIRUBIN TOTAL: 0.7 mg/dL (ref 0.3–1.2)
BUN: 17 mg/dL (ref 6–20)
CALCIUM: 9.8 mg/dL (ref 8.9–10.3)
CO2: 25 mmol/L (ref 22–32)
Chloride: 103 mmol/L (ref 101–111)
Creatinine, Ser: 1.13 mg/dL — ABNORMAL HIGH (ref 0.44–1.00)
GFR calc non Af Amer: 49 mL/min — ABNORMAL LOW (ref >60)
GFR, EST AFRICAN AMERICAN: 57 mL/min — AB (ref >60)
GLUCOSE: 88 mg/dL (ref 65–99)
POTASSIUM: 4.1 mmol/L (ref 3.5–5.1)
Sodium: 136 mmol/L (ref 135–145)
TOTAL PROTEIN: 7.1 g/dL (ref 6.5–8.1)

## 2017-07-05 LAB — DIFFERENTIAL
Basophils Absolute: 0.1 10*3/uL (ref 0.0–0.1)
Basophils Relative: 1 %
EOS ABS: 0.1 10*3/uL (ref 0.0–0.7)
EOS PCT: 2 %
LYMPHS ABS: 1.9 10*3/uL (ref 0.7–4.0)
LYMPHS PCT: 25 %
MONO ABS: 0.4 10*3/uL (ref 0.1–1.0)
Monocytes Relative: 6 %
NEUTROS PCT: 66 %
Neutro Abs: 5.1 10*3/uL (ref 1.7–7.7)

## 2017-07-05 LAB — I-STAT CHEM 8, ED
BUN: 19 mg/dL (ref 6–20)
CALCIUM ION: 1.23 mmol/L (ref 1.15–1.40)
Chloride: 102 mmol/L (ref 101–111)
Creatinine, Ser: 1.1 mg/dL — ABNORMAL HIGH (ref 0.44–1.00)
Glucose, Bld: 90 mg/dL (ref 65–99)
HEMATOCRIT: 40 % (ref 36.0–46.0)
HEMOGLOBIN: 13.6 g/dL (ref 12.0–15.0)
Potassium: 4.1 mmol/L (ref 3.5–5.1)
SODIUM: 139 mmol/L (ref 135–145)
TCO2: 26 mmol/L (ref 22–32)

## 2017-07-05 LAB — PROTIME-INR
INR: 1.07
PROTHROMBIN TIME: 13.8 s (ref 11.4–15.2)

## 2017-07-05 LAB — URINALYSIS, COMPLETE (UACMP) WITH MICROSCOPIC
Bacteria, UA: NONE SEEN
Bilirubin Urine: NEGATIVE
Glucose, UA: NEGATIVE mg/dL
HGB URINE DIPSTICK: NEGATIVE
Ketones, ur: NEGATIVE mg/dL
Leukocytes, UA: NEGATIVE
NITRITE: NEGATIVE
Protein, ur: NEGATIVE mg/dL
Specific Gravity, Urine: 1.026 (ref 1.005–1.030)
pH: 6 (ref 5.0–8.0)

## 2017-07-05 LAB — I-STAT TROPONIN, ED: TROPONIN I, POC: 0 ng/mL (ref 0.00–0.08)

## 2017-07-05 LAB — APTT: aPTT: 30 seconds (ref 24–36)

## 2017-07-05 LAB — MRSA PCR SCREENING: MRSA BY PCR: NEGATIVE

## 2017-07-05 MED ORDER — GADOBENATE DIMEGLUMINE 529 MG/ML IV SOLN
17.0000 mL | Freq: Once | INTRAVENOUS | Status: AC | PRN
  Administered 2017-07-05: 17 mL via INTRAVENOUS

## 2017-07-05 MED ORDER — SODIUM CHLORIDE 0.9 % IV SOLN
INTRAVENOUS | Status: DC
  Administered 2017-07-05: 22:00:00 via INTRAVENOUS
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
## 2017-07-05 NOTE — Telephone Encounter (Signed)
Patient sent to emergency room this morning after scan at [facility] revealed a hemorrhage (see below):  : 1. Late subacute appearing intra-axial hemorrhage in the superior right occipital lobe with estimated blood volume 9 mL. Mild surrounding edema and minimal regional mass effect. 2. 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 Suspect chronic small vessel disease such as due to hypertension is the etiology of the hemorrhage in #1.
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
## 2017-07-05 NOTE — ED Notes (Signed)
ED Provider at bedside. 

## 2017-07-05 NOTE — H&P (Signed)
History and Physical  Kendra Deleon:[id number] DOB: Feb 05, 1951 DOA: 07/05/2017  Referring physician: Dr. Cathi Roan PCP: Gaynelle Arabian, MD  Outpatient Specialists: Neurosurgery Patient coming from: Home  Chief Complaint: Asked by her MD to present to the ED after abnormal brain MRI today 07/05/17  HPI: Kendra Deleon is a 66 y.o. female with medical history significant for uncontrolled HTN, HLD who resented to ED [facility] after being advised by her MD due to abnormal brain MRI taken today 07/05/17. Reports that for the past 3 weeks she has been having intermittent migraines. Lost her peripheral vision on the left which is persistent on her exam. Denies recent trauma to back of the head, however endorses bumping her left eye about a week ago. Denies dysphagia or motor sensory deficits. States BP has been uncontrolled since switching to different BP meds few weeks ago.  ED Course: Neurology contacted, neuro checks and cardiac monitoring.  Review of Systems:  As noted in the HPI.  Review of systems are otherwise negative   Past Medical History:  Diagnosis Date  . Chronic kidney disease    stage 3 at last physical  . Depression   . GERD (gastroesophageal reflux disease)   . Hyperlipidemia   . Hypertension   . PONV (postoperative nausea and vomiting)    No problem with newwer anesthesia   Past Surgical History:  Procedure Laterality Date  . achilles tendon rupture Left    spurs  . BREAST REDUCTION SURGERY    . COLONOSCOPY W/ POLYPECTOMY    . OOPHORECTOMY    . right knee menisectomy  Right   . TENNIS ELBOW RELEASE/NIRSCHEL PROCEDURE Right   . TONSILLECTOMY     age 60  . TUBAL LIGATION  1980    Social History:  reports that  has never smoked. she has never used smokeless tobacco. She reports that she does not drink alcohol or use drugs.   Allergies  Allergen Reactions  . No Known Allergies     Family History  Problem Relation Age of Onset  . Hypertension Mother   .  Hyperlipidemia Mother   . Coronary artery disease Father        died at 58 from MI  . Diabetes Mellitus I Father        father had juvenile diabetes  . Breast cancer Sister   . Heart attack Maternal Grandfather   . Heart attack Paternal Grandfather   . Stroke Maternal Grandmother   . Stroke Paternal Grandmother      Prior to Admission medications   Medication Sig Start Date End Date Taking? Authorizing Provider  amLODipine (NORVASC) 5 MG tablet Take 5 mg by mouth daily.   Yes [provider]  aspirin EC 325 MG tablet Take 1 tablet (325 mg total) by mouth daily. 06/24/17  Yes Garvin Fila, MD  aspirin-acetaminophen-caffeine (EXCEDRIN MIGRAINE) [phone] MG tablet Take 1 tablet every 6 (six) hours as needed by mouth for headache.   Yes [provider]  Calcium Carbonate-Vitamin D3 (CALCIUM 600-D) 600-400 MG-UNIT TABS Take 1 tablet by mouth daily.   Yes [provider]  Cholecalciferol (VITAMIN D3) 2000 UNITS TABS Take 2,000 Units by mouth daily.    Yes [provider]  fenofibrate 160 MG tablet Take 160 mg by mouth daily.  05/17/15  Yes [provider]  FLUoxetine (PROZAC) 20 MG capsule Take 20 mg by mouth daily.  04/24/15  Yes [provider]  ibuprofen (ADVIL,MOTRIN) 200 MG tablet Take  400 mg every 6 (six) hours as needed by mouth for headache or moderate pain.   Yes [provider]  pantoprazole (PROTONIX) 40 MG tablet Take 40 mg by mouth daily.  04/28/15  Yes [provider]  simvastatin (ZOCOR) 40 MG tablet Take 40 mg daily by mouth. 05/20/17  Yes [provider]    Physical Exam: BP (!) 154/73   Pulse (!) 57   Temp 97.8 F (36.6 C) (Oral)   Resp 16   Ht 5\' 4"  (1.626 m)   Wt 64 kg (141 lb)   SpO2 99%   BMI 24.20 kg/m   General:  66 yo CF Well developed well nourished sitting up in bed in NAD Eyes: Pupils are round and reactive to light with accomodation. Anicteric scleare  ENT: mucous membranes  are moist no erythema or lesions Neck: No JVD or thyromegaly Cardiovascular: RRR with no murmurs rubs or gallops Respiratory: CTA with no wheezes or ronchi  Abdomen: soft NT ND with NBS x 4 quadrants Skin: No open lesions Musculoskeletal: 5/5 strength in all 4 Psychiatric: Mood is appropriate for condition and setyting Neurologic: Vision loss noted on left inferior periphery          Labs on Admission:  Basic Metabolic Panel: Recent Labs  Lab 07/05/17 1231 07/05/17 1242  NA 136 139  K 4.1 4.1  CL 103 102  CO2 25  --   GLUCOSE 88 90  BUN 17 19  CREATININE 1.13* 1.10*  CALCIUM 9.8  --    Liver Function Tests: Recent Labs  Lab 07/05/17 1231  AST 26  ALT 16  ALKPHOS 54  BILITOT 0.7  PROT 7.1  ALBUMIN 4.4   No results for input(s): LIPASE, AMYLASE in the last 168 hours. No results for input(s): AMMONIA in the last 168 hours. CBC: Recent Labs  Lab 07/05/17 1231 07/05/17 1242  WBC 7.6  --   NEUTROABS 5.1  --   HGB 12.2 13.6  HCT 39.2 40.0  MCV 77.3*  --   PLT 352  --    Cardiac Enzymes: No results for input(s): CKTOTAL, CKMB, CKMBINDEX, TROPONINI in the last 168 hours.  BNP (last 3 results) No results for input(s): BNP in the last 8760 hours.  ProBNP (last 3 results) No results for input(s): PROBNP in the last 8760 hours.  CBG: No results for input(s): GLUCAP in the last 168 hours.  Radiological Exams on Admission: [name] ZH Contrast  Result Date: 07/05/2017 CLINICAL DATA:  66 year old female with left him ominous hemianopsia. Symptom onset 2 weeks ago, lost peripheral vision in left eye. "Migraine headache" at that time. Suspected stroke. Creatinine was obtained on site at [place] at [address] Results: Creatinine 1.0 mg/dL. EXAM: MRI HEAD WITHOUT AND WITH CONTRAST MRA HEAD WITHOUT CONTRAST MRA NECK WITHOUT AND WITH CONTRAST TECHNIQUE: Multiplanar, multiecho pulse sequences of the brain and surrounding structures were obtained  without and with intravenous contrast. Angiographic images of the Circle of Willis were obtained using MRA technique without intravenous contrast. Angiographic images of the neck were obtained using MRA technique without and with intravenous contrast. Carotid stenosis measurements (when applicable) are obtained utilizing NASCET criteria, using the distal internal carotid diameter as the denominator. CONTRAST:  17 mL MultiHance COMPARISON:  Cervical spine MRI 11/01/2016. Brain MRI 05/28/2015. Neck MRI 12/21/2013. FINDINGS: MRI HEAD FINDINGS Brain: 21 x 24 x 34 mm T1 and FLAIR hyperintense and mixed intrinsic T1 signal rounded mass in the superior  right occipital lobe is most compatible with intra-axial hemorrhage. Estimated blood volume 9 mL. Associated restricted diffusion of the blood products, but no surrounding diffusion restriction elsewhere in the right PCA territory. Minimal regional mass effect. Mild surrounding FLAIR and T2 hyperintensity tracking toward the right lateral ventricle (Series 13, image 14). No intraventricular or extra-axial hemorrhage identified. Following contrast, no abnormal enhancement. There are multiple small chronic micro hemorrhages elsewhere including the left MCA territory (series 17, image 21) and also in the contralateral left occipital lobe (image 16) an these may have progressed since 2016 (susceptibility weighted imaging today but not on the prior study). Patchy and scattered bilateral cerebral white matter T2 and FLAIR hyperintensity has mildly progressed in both hemispheres since 2016. No cortical encephalomalacia identified. No other restricted diffusion. No midline shift, ventriculomegaly, or extra-axial fluid. Cervicomedullary junction and pituitary are within normal limits. Vascular: Major intracranial vascular flow voids are stable since 2016. The major dural venous sinuses are enhancing and appear to be patent. Skull and upper cervical spine: Negative aside from possible  ACDF hardware artifact along the inferior C3 endplate. Normal bone marrow signal. Sinuses/Orbits: Stable. Other: Mastoid air cells are clear. Visible internal auditory structures appear normal. Scalp and face soft tissues appear negative. MRA NECK FINDINGS Precontrast time-of-flight images reveal antegrade flow in both carotid and vertebral arteries in the neck to the skullbase. Both carotid bifurcations are patent. Post-contrast neck MRA images reveal a 3 vessel arch configuration. No great vessel origin stenosis. Tortuous proximal CCA is. Otherwise negative left CCA, left carotid bifurcation and cervical left ICA. There is irregularity along the medial right ICA origin with less than 50 % stenosis with respect to the distal vessel. Otherwise negative cervical right ICA. No proximal subclavian artery stenosis. Normal vertebral artery origins. Codominant vertebral arteries are patent to the skullbase without stenosis. MRA HEAD FINDINGS Antegrade flow in the posterior circulation with codominant distal vertebral arteries. Normal PICA origins. No distal vertebral stenosis. Patent vertebrobasilar junction and basilar artery without stenosis. Normal SCA and PCA origins. Posterior communicating arteries are present. Bilateral PCA branches are within normal limits ; minimal mass effect on the distal most right P3 segments. Antegrade flow in both ICA siphons. No siphon stenosis. Ophthalmic and posterior communicating artery origins are normal. Normal carotid termini. Normal MCA and ACA origins. Anterior communicating artery and visible ACA branches are within normal limits. MCA M1 segments, MCA bifurcations, and visible bilateral MCA branches are within normal limits. IMPRESSION: 1. Late subacute appearing intra-axial hemorrhage in the superior right occipital lobe with estimated blood volume 9 mL. Mild surrounding edema and minimal regional mass effect. 2. Multiple underlying chronic micro hemorrhages in both the left PCA  and left MCA territories. Suspect chronic small vessel disease such as due to hypertension is the etiology of the hemorrhage in #1. 3. Superimposed moderate for age cerebral white matter changes with mild progression since 2016, likely also small vessel related. 4. Negative neck MRA aside from mild atherosclerosis at the right ICA origin, no stenosis. 5.  Negative intracranial MRA. The Brain MRI findings were discussed by telephone with the patient and her husband at 1150 hours on 07/05/2017. I advised them to report to the [facility] Emergency Department for further evaluation and treatment planning. I also briefly discussed the patient and her MRI findings with the [facility] ED Charge Nurse Roselyn Reef at 1210 hours. Electronically Signed   By: Genevie Ann M.D.   On: 07/05/2017 12:37   Mr Jodene Nam Neck W Wo Contrast  Result Date: 07/05/2017 CLINICAL DATA:  66 year old female with left him ominous hemianopsia. Symptom onset 2 weeks ago, lost peripheral vision in left eye. "Migraine headache" at that time. Suspected stroke. Creatinine was obtained on site at [place] at [address] Results: Creatinine 1.0 mg/dL. EXAM: MRI HEAD WITHOUT AND WITH CONTRAST MRA HEAD WITHOUT CONTRAST MRA NECK WITHOUT AND WITH CONTRAST TECHNIQUE: Multiplanar, multiecho pulse sequences of the brain and surrounding structures were obtained without and with intravenous contrast. Angiographic images of the Circle of Willis were obtained using MRA technique without intravenous contrast. Angiographic images of the neck were obtained using MRA technique without and with intravenous contrast. Carotid stenosis measurements (when applicable) are obtained utilizing NASCET criteria, using the distal internal carotid diameter as the denominator. CONTRAST:  17 mL MultiHance COMPARISON:  Cervical spine MRI 11/01/2016. Brain MRI 05/28/2015. Neck MRI 12/21/2013. FINDINGS: MRI HEAD FINDINGS Brain: 21 x 24 x 34 mm T1 and FLAIR hyperintense and mixed intrinsic T1  signal rounded mass in the superior right occipital lobe is most compatible with intra-axial hemorrhage. Estimated blood volume 9 mL. Associated restricted diffusion of the blood products, but no surrounding diffusion restriction elsewhere in the right PCA territory. Minimal regional mass effect. Mild surrounding FLAIR and T2 hyperintensity tracking toward the right lateral ventricle (Series 13, image 14). No intraventricular or extra-axial hemorrhage identified. Following contrast, no abnormal enhancement. There are multiple small chronic micro hemorrhages elsewhere including the left MCA territory (series 17, image 21) and also in the contralateral left occipital lobe (image 16) an these may have progressed since 2016 (susceptibility weighted imaging today but not on the prior study). Patchy and scattered bilateral cerebral white matter T2 and FLAIR hyperintensity has mildly progressed in both hemispheres since 2016. No cortical encephalomalacia identified. No other restricted diffusion. No midline shift, ventriculomegaly, or extra-axial fluid. Cervicomedullary junction and pituitary are within normal limits. Vascular: Major intracranial vascular flow voids are stable since 2016. The major dural venous sinuses are enhancing and appear to be patent. Skull and upper cervical spine: Negative aside from possible ACDF hardware artifact along the inferior C3 endplate. Normal bone marrow signal. Sinuses/Orbits: Stable. Other: Mastoid air cells are clear. Visible internal auditory structures appear normal. Scalp and face soft tissues appear negative. MRA NECK FINDINGS Precontrast time-of-flight images reveal antegrade flow in both carotid and vertebral arteries in the neck to the skullbase. Both carotid bifurcations are patent. Post-contrast neck MRA images reveal a 3 vessel arch configuration. No great vessel origin stenosis. Tortuous proximal CCA is. Otherwise negative left CCA, left carotid bifurcation and cervical  left ICA. There is irregularity along the medial right ICA origin with less than 50 % stenosis with respect to the distal vessel. Otherwise negative cervical right ICA. No proximal subclavian artery stenosis. Normal vertebral artery origins. Codominant vertebral arteries are patent to the skullbase without stenosis. MRA HEAD FINDINGS Antegrade flow in the posterior circulation with codominant distal vertebral arteries. Normal PICA origins. No distal vertebral stenosis. Patent vertebrobasilar junction and basilar artery without stenosis. Normal SCA and PCA origins. Posterior communicating arteries are present. Bilateral PCA branches are within normal limits ; minimal mass effect on the distal most right P3 segments. Antegrade flow in both ICA siphons. No siphon stenosis. Ophthalmic and posterior communicating artery origins are normal. Normal carotid termini. Normal MCA and ACA origins. Anterior communicating artery and visible ACA branches are within normal limits. MCA M1 segments, MCA bifurcations, and visible bilateral MCA branches are within normal limits. IMPRESSION: 1. Late  subacute appearing intra-axial hemorrhage in the superior right occipital lobe with estimated blood volume 9 mL. Mild surrounding edema and minimal regional mass effect. 2. 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 Suspect chronic small vessel disease such as due to hypertension is the etiology of the hemorrhage in #1. 3. Superimposed moderate for age cerebral white matter changes with mild progression since 2016, likely also small vessel related. 4. Negative neck MRA aside from mild atherosclerosis at the right ICA origin, no stenosis. 5.  Negative intracranial MRA. The Brain MRI findings were discussed by telephone with the patient and her husband at 1150 hours on 07/05/2017. I advised them to report to the [facility] Emergency Department for further evaluation and treatment planning. I also  briefly discussed the patient and her MRI findings with the [facility] ED Charge Nurse Roselyn Reef at 1210 hours. Electronically Signed   By: Genevie Ann M.D.   On: 07/05/2017 12:37   Mr Jeri Cos PX Contrast  Result Date: 07/05/2017 CLINICAL DATA:  66 year old female with left him ominous hemianopsia. Symptom onset 2 weeks ago, lost peripheral vision in left eye. "Migraine headache" at that time. Suspected stroke. Creatinine was obtained on site at [place] at [address] Results: Creatinine 1.0 mg/dL. EXAM: MRI HEAD WITHOUT AND WITH CONTRAST MRA HEAD WITHOUT CONTRAST MRA NECK WITHOUT AND WITH CONTRAST TECHNIQUE: Multiplanar, multiecho pulse sequences of the brain and surrounding structures were obtained without and with intravenous contrast. Angiographic images of the Circle of Willis were obtained using MRA technique without intravenous contrast. Angiographic images of the neck were obtained using MRA technique without and with intravenous contrast. Carotid stenosis measurements (when applicable) are obtained utilizing NASCET criteria, using the distal internal carotid diameter as the denominator. CONTRAST:  17 mL MultiHance COMPARISON:  Cervical spine MRI 11/01/2016. Brain MRI 05/28/2015. Neck MRI 12/21/2013. FINDINGS: MRI HEAD FINDINGS Brain: 21 x 24 x 34 mm T1 and FLAIR hyperintense and mixed intrinsic T1 signal rounded mass in the superior right occipital lobe is most compatible with intra-axial hemorrhage. Estimated blood volume 9 mL. Associated restricted diffusion of the blood products, but no surrounding diffusion restriction elsewhere in the right PCA territory. Minimal regional mass effect. Mild surrounding FLAIR and T2 hyperintensity tracking toward the right lateral ventricle (Series 13, image 14). No intraventricular or extra-axial hemorrhage identified. Following contrast, no abnormal enhancement. There are multiple small chronic micro hemorrhages elsewhere including the left MCA territory  (series 17, image 21) and also in the contralateral left occipital lobe (image 16) an these may have progressed since 2016 (susceptibility weighted imaging today but not on the prior study). Patchy and scattered bilateral cerebral white matter T2 and FLAIR hyperintensity has mildly progressed in both hemispheres since 2016. No cortical encephalomalacia identified. No other restricted diffusion. No midline shift, ventriculomegaly, or extra-axial fluid. Cervicomedullary junction and pituitary are within normal limits. Vascular: Major intracranial vascular flow voids are stable since 2016. The major dural venous sinuses are enhancing and appear to be patent. Skull and upper cervical spine: Negative aside from possible ACDF hardware artifact along the inferior C3 endplate. Normal bone marrow signal. Sinuses/Orbits: Stable. Other: Mastoid air cells are clear. Visible internal auditory structures appear normal. Scalp and face soft tissues appear negative. MRA NECK FINDINGS Precontrast time-of-flight images reveal antegrade flow in both carotid and vertebral arteries in the neck to the skullbase. Both carotid bifurcations are patent. Post-contrast neck MRA images reveal a 3 vessel arch configuration. No great  vessel origin stenosis. Tortuous proximal CCA is. Otherwise negative left CCA, left carotid bifurcation and cervical left ICA. There is irregularity along the medial right ICA origin with less than 50 % stenosis with respect to the distal vessel. Otherwise negative cervical right ICA. No proximal subclavian artery stenosis. Normal vertebral artery origins. Codominant vertebral arteries are patent to the skullbase without stenosis. MRA HEAD FINDINGS Antegrade flow in the posterior circulation with codominant distal vertebral arteries. Normal PICA origins. No distal vertebral stenosis. Patent vertebrobasilar junction and basilar artery without stenosis. Normal SCA and PCA origins. Posterior communicating arteries are  present. Bilateral PCA branches are within normal limits ; minimal mass effect on the distal most right P3 segments. Antegrade flow in both ICA siphons. No siphon stenosis. Ophthalmic and posterior communicating artery origins are normal. Normal carotid termini. Normal MCA and ACA origins. Anterior communicating artery and visible ACA branches are within normal limits. MCA M1 segments, MCA bifurcations, and visible bilateral MCA branches are within normal limits. IMPRESSION: 1. Late subacute appearing intra-axial hemorrhage in the superior right occipital lobe with estimated blood volume 9 mL. Mild surrounding edema and minimal regional mass effect. 2. 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 Suspect chronic small vessel disease such as due to hypertension is the etiology of the hemorrhage in #1. 3. Superimposed moderate for age cerebral white matter changes with mild progression since 2016, likely also small vessel related. 4. Negative neck MRA aside from mild atherosclerosis at the right ICA origin, no stenosis. 5.  Negative intracranial MRA. The Brain MRI findings were discussed by telephone with the patient and her husband at 1150 hours on 07/05/2017. I advised them to report to the [facility] Emergency Department for further evaluation and treatment planning. I also briefly discussed the patient and her MRI findings with the [facility] ED Charge Nurse Roselyn Reef at 1210 hours. Electronically Signed   By: Genevie Ann M.D.   On: 07/05/2017 12:37    EKG: Independently reviewed. Obtaining a 12 lead EKG  Assessment/Plan Present on Admission: . 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  Disposition Plan: Will admit to the stepdown unit as inpatient status   Consults called: Neurology called by ED physician  Admission status: Inpatient    Kayleen Memos MD [facility] Pager [phone]  If 7PM-7AM, please contact night-coverage [email or web address] Password [email or web address]  07/05/2017, 6:02 PM

## 2017-07-05 NOTE — ED Triage Notes (Addendum)
Pt states she had a bad migraine 3 weeks ago. Saw her MD and they orderd blood tests and MRI. Pt has been headache free for 2 days now. Pt states she has a pins and needles feeling to her left cheek. Pt states right when migraine occurred she had peripheral vision issues, still having visual field loss to left side. Pt states MD stated they will place her with OT. Pt had MRI done this morning and on call MD called and sent her to ER right after. Pt also states pins and needles to right side but that is related to her neck surgery- per pt.  MD thinks her hemorrhage occurred when the migraine first occurred several weeks ago. Pt had her MRI at [place]. MD Nevada Crane, here at [facility], has reviewed results with the pt.

## 2017-07-06 DIAGNOSIS — I10 Essential (primary) hypertension: Secondary | ICD-10-CM

## 2017-07-06 DIAGNOSIS — I619 Nontraumatic intracerebral hemorrhage, unspecified: Secondary | ICD-10-CM | POA: Diagnosis not present

## 2017-07-06 DIAGNOSIS — I639 Cerebral infarction, unspecified: Secondary | ICD-10-CM | POA: Diagnosis present

## 2017-07-06 DIAGNOSIS — I63431 Cerebral infarction due to embolism of right posterior cerebral artery: Secondary | ICD-10-CM | POA: Diagnosis not present

## 2017-07-06 DIAGNOSIS — I611 Nontraumatic intracerebral hemorrhage in hemisphere, cortical: Secondary | ICD-10-CM

## 2017-07-06 LAB — COMPREHENSIVE METABOLIC PANEL
ALBUMIN: 3.7 g/dL (ref 3.5–5.0)
ALK PHOS: 48 U/L (ref 38–126)
ALT: 16 U/L (ref 14–54)
ANION GAP: 7 (ref 5–15)
AST: 24 U/L (ref 15–41)
BILIRUBIN TOTAL: 0.6 mg/dL (ref 0.3–1.2)
BUN: 15 mg/dL (ref 6–20)
CALCIUM: 9.3 mg/dL (ref 8.9–10.3)
CO2: 26 mmol/L (ref 22–32)
CREATININE: 1.06 mg/dL — AB (ref 0.44–1.00)
Chloride: 106 mmol/L (ref 101–111)
GFR calc Af Amer: >60 mL/min (ref >60)
GFR calc non Af Amer: 53 mL/min — ABNORMAL LOW (ref >60)
GLUCOSE: 88 mg/dL (ref 65–99)
Potassium: 4.2 mmol/L (ref 3.5–5.1)
Sodium: 139 mmol/L (ref 135–145)
Total Protein: 6 g/dL — ABNORMAL LOW (ref 6.5–8.1)

## 2017-07-06 LAB — CBC
HEMATOCRIT: 35.4 % — AB (ref 36.0–46.0)
HEMOGLOBIN: 11.3 g/dL — AB (ref 12.0–15.0)
MCH: 24.6 pg — ABNORMAL LOW (ref 26.0–34.0)
MCHC: 31.9 g/dL (ref 30.0–36.0)
MCV: 77 fL — AB (ref 78.0–100.0)
Platelets: 275 10*3/uL (ref 150–400)
RBC: 4.6 MIL/uL (ref 3.87–5.11)
RDW: 17.1 % — ABNORMAL HIGH (ref 11.5–15.5)
WBC: 5.9 10*3/uL (ref 4.0–10.5)

## 2017-07-06 MED ORDER — PNEUMOCOCCAL VAC POLYVALENT 25 MCG/0.5ML IJ INJ
0.5000 mL | INJECTION | INTRAMUSCULAR | Status: DC
Start: 2017-07-07 — End: 2017-07-06

## 2017-07-06 MED ORDER — PNEUMOCOCCAL VAC POLYVALENT 25 MCG/0.5ML IJ INJ
0.5000 mL | INJECTION | INTRAMUSCULAR | Status: AC
  Administered 2017-07-06: 0.5 mL via INTRAMUSCULAR
  Filled 2017-07-06: qty 0.5

## 2017-07-06 MED ORDER — STROKE: EARLY STAGES OF RECOVERY BOOK
Freq: Once | Status: AC
  Administered 2017-07-06: 17:00:00
  Filled 2017-07-06 (×2): qty 1

## 2017-07-06 NOTE — Progress Notes (Signed)
STROKE TEAM PROGRESS NOTE   SUBJECTIVE (INTERVAL HISTORY) Patient found sitting in bed in no acute distress. Voices no new complaints. No new events reported overnight.  Daughter at bedside.  Plan of care reviewed with patient and daughter.  Verbalize good understanding.  Patient has been instructed to follow-up with Dr. Leonie Man in his neurology clinic in 2 weeks.   OBJECTIVE No results for input(s): GLUCAP in the last 168 hours. Recent Labs  Lab 07/05/17 1231 07/05/17 1242 07/06/17 0515  NA 136 139 139  K 4.1 4.1 4.2  CL 103 102 106  CO2 25  --  26  GLUCOSE 88 90 88  BUN 17 19 15   CREATININE 1.13* 1.10* 1.06*  CALCIUM 9.8  --  9.3   Recent Labs  Lab 07/05/17 1231 07/06/17 0515  AST 26 24  ALT 16 16  ALKPHOS 54 48  BILITOT 0.7 0.6  PROT 7.1 6.0*  ALBUMIN 4.4 3.7   Recent Labs  Lab 07/05/17 1231 07/05/17 1242 07/06/17 0515  WBC 7.6  --  5.9  NEUTROABS 5.1  --   --   HGB 12.2 13.6 11.3*  HCT 39.2 40.0 35.4*  MCV 77.3*  --  77.0*  PLT 352  --  275   No results for input(s): CKTOTAL, CKMB, CKMBINDEX, TROPONINI in the last 168 hours. Recent Labs    07/05/17 1231  LABPROT 13.8  INR 1.07   Recent Labs    07/05/17 1228  COLORURINE YELLOW  LABSPEC 1.026  PHURINE 6.0  GLUCOSEU NEGATIVE  HGBUR NEGATIVE  BILIRUBINUR NEGATIVE  KETONESUR NEGATIVE  PROTEINUR NEGATIVE  NITRITE NEGATIVE  LEUKOCYTESUR NEGATIVE       Component Value Date/Time   CHOL 180 06/24/2017 1500   TRIG 89 06/24/2017 1500   HDL 65 06/24/2017 1500   CHOLHDL 2.8 06/24/2017 1500   CHOLHDL 3.3 05/29/2015 0143   VLDL 19 05/29/2015 0143   LDLCALC 97 06/24/2017 1500   Lab Results  Component Value Date   HGBA1C 6.0 (H) 06/24/2017   No results found for: LABOPIA, COCAINSCRNUR, LABBENZ, AMPHETMU, THCU, LABBARB  No results for input(s): ETH in the last 168 hours.  IMAGING: I have personally reviewed the radiological images below and agree with the radiology interpretations.  [name] YW  Contrast  Result Date: 07/05/2017 CLINICAL DATA:  66 year old female with left him ominous hemianopsia. Symptom onset 2 weeks ago, lost peripheral vision in left eye. "Migraine headache" at that time. Suspected stroke. Creatinine was obtained on site at [place] at [address] Results: Creatinine 1.0 mg/dL. EXAM: MRI HEAD WITHOUT AND WITH CONTRAST MRA HEAD WITHOUT CONTRAST MRA NECK WITHOUT AND WITH CONTRAST TECHNIQUE: Multiplanar, multiecho pulse sequences of the brain and surrounding structures were obtained without and with intravenous contrast. Angiographic images of the Circle of Willis were obtained using MRA technique without intravenous contrast. Angiographic images of the neck were obtained using MRA technique without and with intravenous contrast. Carotid stenosis measurements (when applicable) are obtained utilizing NASCET criteria, using the distal internal carotid diameter as the denominator. CONTRAST:  17 mL MultiHance COMPARISON:  Cervical spine MRI 11/01/2016. Brain MRI 05/28/2015. Neck MRI 12/21/2013. FINDINGS: MRI HEAD FINDINGS Brain: 21 x 24 x 34 mm T1 and FLAIR hyperintense and mixed intrinsic T1 signal rounded mass in the superior right occipital lobe is most compatible with intra-axial hemorrhage. Estimated blood volume 9 mL. Associated restricted diffusion of the blood products, but no surrounding diffusion restriction elsewhere in the right PCA territory. Minimal  regional mass effect. Mild surrounding FLAIR and T2 hyperintensity tracking toward the right lateral ventricle (Series 13, image 14). No intraventricular or extra-axial hemorrhage identified. Following contrast, no abnormal enhancement. There are multiple small chronic micro hemorrhages elsewhere including the left MCA territory (series 17, image 21) and also in the contralateral left occipital lobe (image 16) an these may have progressed since 2016 (susceptibility weighted imaging today but not on the prior  study). Patchy and scattered bilateral cerebral white matter T2 and FLAIR hyperintensity has mildly progressed in both hemispheres since 2016. No cortical encephalomalacia identified. No other restricted diffusion. No midline shift, ventriculomegaly, or extra-axial fluid. Cervicomedullary junction and pituitary are within normal limits. Vascular: Major intracranial vascular flow voids are stable since 2016. The major dural venous sinuses are enhancing and appear to be patent. Skull and upper cervical spine: Negative aside from possible ACDF hardware artifact along the inferior C3 endplate. Normal bone marrow signal. Sinuses/Orbits: Stable. Other: Mastoid air cells are clear. Visible internal auditory structures appear normal. Scalp and face soft tissues appear negative. MRA NECK FINDINGS Precontrast time-of-flight images reveal antegrade flow in both carotid and vertebral arteries in the neck to the skullbase. Both carotid bifurcations are patent. Post-contrast neck MRA images reveal a 3 vessel arch configuration. No great vessel origin stenosis. Tortuous proximal CCA is. Otherwise negative left CCA, left carotid bifurcation and cervical left ICA. There is irregularity along the medial right ICA origin with less than 50 % stenosis with respect to the distal vessel. Otherwise negative cervical right ICA. No proximal subclavian artery stenosis. Normal vertebral artery origins. Codominant vertebral arteries are patent to the skullbase without stenosis. MRA HEAD FINDINGS Antegrade flow in the posterior circulation with codominant distal vertebral arteries. Normal PICA origins. No distal vertebral stenosis. Patent vertebrobasilar junction and basilar artery without stenosis. Normal SCA and PCA origins. Posterior communicating arteries are present. Bilateral PCA branches are within normal limits ; minimal mass effect on the distal most right P3 segments. Antegrade flow in both ICA siphons. No siphon stenosis. Ophthalmic  and posterior communicating artery origins are normal. Normal carotid termini. Normal MCA and ACA origins. Anterior communicating artery and visible ACA branches are within normal limits. MCA M1 segments, MCA bifurcations, and visible bilateral MCA branches are within normal limits. IMPRESSION: 1. Late subacute appearing intra-axial hemorrhage in the superior right occipital lobe with estimated blood volume 9 mL. Mild surrounding edema and minimal regional mass effect. 2. 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 Suspect chronic small vessel disease such as due to hypertension is the etiology of the hemorrhage in #1. 3. Superimposed moderate for age cerebral white matter changes with mild progression since 2016, likely also small vessel related. 4. Negative neck MRA aside from mild atherosclerosis at the right ICA origin, no stenosis. 5.  Negative intracranial MRA. The Brain MRI findings were discussed by telephone with the patient and her husband at 1150 hours on 07/05/2017. I advised them to report to the [facility] Emergency Department for further evaluation and treatment planning. I also briefly discussed the patient and her MRI findings with the [facility] ED Charge Nurse Roselyn Reef at 1210 hours. Electronically Signed   By: Genevie Ann M.D.   On: 07/05/2017 12:37   Mr Jodene Nam Neck W Wo Contrast  Result Date: 07/05/2017 CLINICAL DATA:  66 year old female with left him ominous hemianopsia. Symptom onset 2 weeks ago, lost peripheral vision in left eye. "Migraine headache" at that time. Suspected stroke. Creatinine was  obtained on site at [place] at [address] Results: Creatinine 1.0 mg/dL. EXAM: MRI HEAD WITHOUT AND WITH CONTRAST MRA HEAD WITHOUT CONTRAST MRA NECK WITHOUT AND WITH CONTRAST TECHNIQUE: Multiplanar, multiecho pulse sequences of the brain and surrounding structures were obtained without and with intravenous contrast. Angiographic images of the Circle of  Willis were obtained using MRA technique without intravenous contrast. Angiographic images of the neck were obtained using MRA technique without and with intravenous contrast. Carotid stenosis measurements (when applicable) are obtained utilizing NASCET criteria, using the distal internal carotid diameter as the denominator. CONTRAST:  17 mL MultiHance COMPARISON:  Cervical spine MRI 11/01/2016. Brain MRI 05/28/2015. Neck MRI 12/21/2013. FINDINGS: MRI HEAD FINDINGS Brain: 21 x 24 x 34 mm T1 and FLAIR hyperintense and mixed intrinsic T1 signal rounded mass in the superior right occipital lobe is most compatible with intra-axial hemorrhage. Estimated blood volume 9 mL. Associated restricted diffusion of the blood products, but no surrounding diffusion restriction elsewhere in the right PCA territory. Minimal regional mass effect. Mild surrounding FLAIR and T2 hyperintensity tracking toward the right lateral ventricle (Series 13, image 14). No intraventricular or extra-axial hemorrhage identified. Following contrast, no abnormal enhancement. There are multiple small chronic micro hemorrhages elsewhere including the left MCA territory (series 17, image 21) and also in the contralateral left occipital lobe (image 16) an these may have progressed since 2016 (susceptibility weighted imaging today but not on the prior study). Patchy and scattered bilateral cerebral white matter T2 and FLAIR hyperintensity has mildly progressed in both hemispheres since 2016. No cortical encephalomalacia identified. No other restricted diffusion. No midline shift, ventriculomegaly, or extra-axial fluid. Cervicomedullary junction and pituitary are within normal limits. Vascular: Major intracranial vascular flow voids are stable since 2016. The major dural venous sinuses are enhancing and appear to be patent. Skull and upper cervical spine: Negative aside from possible ACDF hardware artifact along the inferior C3 endplate. Normal bone marrow  signal. Sinuses/Orbits: Stable. Other: Mastoid air cells are clear. Visible internal auditory structures appear normal. Scalp and face soft tissues appear negative. MRA NECK FINDINGS Precontrast time-of-flight images reveal antegrade flow in both carotid and vertebral arteries in the neck to the skullbase. Both carotid bifurcations are patent. Post-contrast neck MRA images reveal a 3 vessel arch configuration. No great vessel origin stenosis. Tortuous proximal CCA is. Otherwise negative left CCA, left carotid bifurcation and cervical left ICA. There is irregularity along the medial right ICA origin with less than 50 % stenosis with respect to the distal vessel. Otherwise negative cervical right ICA. No proximal subclavian artery stenosis. Normal vertebral artery origins. Codominant vertebral arteries are patent to the skullbase without stenosis. MRA HEAD FINDINGS Antegrade flow in the posterior circulation with codominant distal vertebral arteries. Normal PICA origins. No distal vertebral stenosis. Patent vertebrobasilar junction and basilar artery without stenosis. Normal SCA and PCA origins. Posterior communicating arteries are present. Bilateral PCA branches are within normal limits ; minimal mass effect on the distal most right P3 segments. Antegrade flow in both ICA siphons. No siphon stenosis. Ophthalmic and posterior communicating artery origins are normal. Normal carotid termini. Normal MCA and ACA origins. Anterior communicating artery and visible ACA branches are within normal limits. MCA M1 segments, MCA bifurcations, and visible bilateral MCA branches are within normal limits. IMPRESSION: 1. Late subacute appearing intra-axial hemorrhage in the superior right occipital lobe with estimated blood volume 9 mL. Mild surrounding edema and minimal regional mass effect. 2. Multiple underlying chronic micro hemorrhages in both the  left PCA and left MCA territories. Suspect chronic small vessel disease such as  due to hypertension is the etiology of the hemorrhage in #1. 3. Superimposed moderate for age cerebral white matter changes with mild progression since 2016, likely also small vessel related. 4. Negative neck MRA aside from mild atherosclerosis at the right ICA origin, no stenosis. 5.  Negative intracranial MRA. The Brain MRI findings were discussed by telephone with the patient and her husband at 1150 hours on 07/05/2017. I advised them to report to the [facility] Emergency Department for further evaluation and treatment planning. I also briefly discussed the patient and her MRI findings with the [facility] ED Charge Nurse Roselyn Reef at 1210 hours. Electronically Signed   By: Genevie Ann M.D.   On: 07/05/2017 12:37   Mr Jeri Cos ZO Contrast  Result Date: 07/05/2017 CLINICAL DATA:  66 year old female with left him ominous hemianopsia. Symptom onset 2 weeks ago, lost peripheral vision in left eye. "Migraine headache" at that time. Suspected stroke. Creatinine was obtained on site at [place] at [address] Results: Creatinine 1.0 mg/dL. EXAM: MRI HEAD WITHOUT AND WITH CONTRAST MRA HEAD WITHOUT CONTRAST MRA NECK WITHOUT AND WITH CONTRAST TECHNIQUE: Multiplanar, multiecho pulse sequences of the brain and surrounding structures were obtained without and with intravenous contrast. Angiographic images of the Circle of Willis were obtained using MRA technique without intravenous contrast. Angiographic images of the neck were obtained using MRA technique without and with intravenous contrast. Carotid stenosis measurements (when applicable) are obtained utilizing NASCET criteria, using the distal internal carotid diameter as the denominator. CONTRAST:  17 mL MultiHance COMPARISON:  Cervical spine MRI 11/01/2016. Brain MRI 05/28/2015. Neck MRI 12/21/2013. FINDINGS: MRI HEAD FINDINGS Brain: 21 x 24 x 34 mm T1 and FLAIR hyperintense and mixed intrinsic T1 signal rounded mass in the superior right occipital lobe is most compatible  with intra-axial hemorrhage. Estimated blood volume 9 mL. Associated restricted diffusion of the blood products, but no surrounding diffusion restriction elsewhere in the right PCA territory. Minimal regional mass effect. Mild surrounding FLAIR and T2 hyperintensity tracking toward the right lateral ventricle (Series 13, image 14). No intraventricular or extra-axial hemorrhage identified. Following contrast, no abnormal enhancement. There are multiple small chronic micro hemorrhages elsewhere including the left MCA territory (series 17, image 21) and also in the contralateral left occipital lobe (image 16) an these may have progressed since 2016 (susceptibility weighted imaging today but not on the prior study). Patchy and scattered bilateral cerebral white matter T2 and FLAIR hyperintensity has mildly progressed in both hemispheres since 2016. No cortical encephalomalacia identified. No other restricted diffusion. No midline shift, ventriculomegaly, or extra-axial fluid. Cervicomedullary junction and pituitary are within normal limits. Vascular: Major intracranial vascular flow voids are stable since 2016. The major dural venous sinuses are enhancing and appear to be patent. Skull and upper cervical spine: Negative aside from possible ACDF hardware artifact along the inferior C3 endplate. Normal bone marrow signal. Sinuses/Orbits: Stable. Other: Mastoid air cells are clear. Visible internal auditory structures appear normal. Scalp and face soft tissues appear negative. MRA NECK FINDINGS Precontrast time-of-flight images reveal antegrade flow in both carotid and vertebral arteries in the neck to the skullbase. Both carotid bifurcations are patent. Post-contrast neck MRA images reveal a 3 vessel arch configuration. No great vessel origin stenosis. Tortuous proximal CCA is. Otherwise negative left CCA, left carotid bifurcation and cervical left ICA. There is irregularity along the medial right ICA origin with less  than 50 %  stenosis with respect to the distal vessel. Otherwise negative cervical right ICA. No proximal subclavian artery stenosis. Normal vertebral artery origins. Codominant vertebral arteries are patent to the skullbase without stenosis. MRA HEAD FINDINGS Antegrade flow in the posterior circulation with codominant distal vertebral arteries. Normal PICA origins. No distal vertebral stenosis. Patent vertebrobasilar junction and basilar artery without stenosis. Normal SCA and PCA origins. Posterior communicating arteries are present. Bilateral PCA branches are within normal limits ; minimal mass effect on the distal most right P3 segments. Antegrade flow in both ICA siphons. No siphon stenosis. Ophthalmic and posterior communicating artery origins are normal. Normal carotid termini. Normal MCA and ACA origins. Anterior communicating artery and visible ACA branches are within normal limits. MCA M1 segments, MCA bifurcations, and visible bilateral MCA branches are within normal limits. IMPRESSION: 1. Late subacute appearing intra-axial hemorrhage in the superior right occipital lobe with estimated blood volume 9 mL. Mild surrounding edema and minimal regional mass effect. 2. 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 Suspect chronic small vessel disease such as due to hypertension is the etiology of the hemorrhage in #1. 3. Superimposed moderate for age cerebral white matter changes with mild progression since 2016, likely also small vessel related. 4. Negative neck MRA aside from mild atherosclerosis at the right ICA origin, no stenosis. 5.  Negative intracranial MRA. The Brain MRI findings were discussed by telephone with the patient and her husband at 1150 hours on 07/05/2017. I advised them to report to the [facility] Emergency Department for further evaluation and treatment planning. I also briefly discussed the patient and her MRI findings with the [facility] ED Charge Nurse  Roselyn Reef at 1210 hours. Electronically Signed   By: Genevie Ann M.D.   On: 07/05/2017 12:37    ECHO-  2 days ago - unremarkable  B/L Carotids:   05/2015 - unremarkable  PHYSICAL EXAM Temp:  [97.7 F (36.5 C)-98.5 F (36.9 C)] 98.5 F (36.9 C) (11/05 1218) Pulse Rate:  [49-87] 60 (11/05 1218) Resp:  [12-25] 20 (11/05 1218) BP: (114-154)/(64-97) 139/79 (11/05 1218) SpO2:  [97 %-100 %] 99 % (11/05 1218)  General - Well nourished, well developed, in no apparent distress Respiratory - Lungs clear bilaterally. No wheezing. Cardiovascular - Regular rate and rhythm with no murmur  NEURO:  Mental Status: AA&Ox3,  Attention span and concentration were normal Recent and remote memory were intact. Fund of Knowledge was assessed and was intact Language: speech is clear.  Naming, repetition, fluency, and comprehension intact. Cranial Nerves: PERRL 54mm/brisk. EOMI, visual fields - left lower quadrantanopia, still able to see moving fingers and able to count them in the left superior quadrants, no facial asymmetry, facial sensation intact, hearing intact, tongue/uvula/soft palate midline, normal sternocleidomastoid and trapezius muscle strength. No evidence of tongue atrophy or fibrillations Motor: 5/5 all over Tone: is normal and bulk is normal Sensation- Intact to light touch bilaterally. No extinction Coordination: FTN intact bilaterally, no ataxia in BLE. Gait- deferred   ASSESSMENT AND PLAN: Kendra Deleon is a 66 y.o. female with PMH of HTN, HLD, and Migraines admitted for 3-week + history of left homonymous hemianopsia. Outpatient MRI today shows a right occipital area of reported hemorrhage  Stroke: MRI brain-right occipital lobe late subacute intra-axial hemorrhage Likely secondary to hypertension.   Recommendations: -Inpatient stroke work-up completed. -Discharge patient home today.  Plan of care discussed with primary attending Dr. Thereasa Solo -Outpatient telemetry monitoring will  be scheduled at follow  up appointment with Dr Leonie Man in 2 weeks -HOLD aspirin for now due to [place]. Dr Leonie Man will decide timing to restart ASA at follow up appointment in 2 weeks. May need repeat CT in 2 weeks before resuming ASA.  -Outpatient OT for Vision loss -Patient has been instructed NO driving until cleared by Opthalmology -Patient has been instructed to seek medical attention with any new or worsening symptoms or sudden headache. -Ongoing aggressive stroke risk factor management   VTE prophylaxis: SCD's /  Diet Heart Room service appropriate? Yes; Fluid consistency: Thin    aspirin 325 mg daily prior to admission, now on No antithrombotic.         Therapy recommendations:  Outpatient OT       Disposition: HOME        Follow up Appointments:    PCP follow up   Follow up with [facility], Dr Leonie Man in 2 weeks  Diabetes:  HgbA1c   6.0 goal < 7.0  Controlled  Hypertension: Stable Goal less than 140/90  Long term BP goal normotensive. Pt encouraged to close monitor BP at home and record and discuss with PCP for medication adjustment if needed  Hyperlipidemia:  Home meds: Zocor 40 mg  LDL    97   , goal < 70  resumed  Zocor 40 mg daily  Continue statin at discharge  Other Stroke Risk Factors:  Advanced age  [facility] day # 1  Renie Ora Stroke Neurology Team 07/06/2017 12:40 PM  I reviewed above note and agree with the assessment and plan. I have made any additions or clarifications directly to the above note. Pt was seen and examined. Still has left lower quadrantanopia. BP 150s. Educated on BP monitoring at home. No driving until cleared by ophthalmology. Hold off ASA For now until sees Dr. Leonie Man in two weeks for follow up. OK to be discharged from stroke standpoint.   Neurology to sign-off at this time. Please call with any further questions or concerns. Follow up with Dr. Leonie Man in 2 weeks. Thank you for this  consultation.  Rosalin Hawking, MD PhD Stroke Neurology 07/06/2017 1:23 PM       To contact Stroke Continuity provider, please refer to [email or web address] After hours, contact General Neurology

## 2017-07-06 NOTE — Progress Notes (Signed)
SLP Cancellation Note  Patient Details Name: Kendra Deleon MRN: [id number] DOB: 1951-07-02   Cancelled treatment:       Reason Eval/Treat Not Completed: SLP screened, no needs identified, will sign off. Orders received for swallowing evaluation. Pt passed the RN stroke swallow screen and therefore, per protocol, can be initiated on a diet without SLP evaluation. Discussed with RN - will defer formal evaluation for now. Please reorder if needed.   Germain Osgood 07/06/2017, 9:13 AM  Germain Osgood, M.A. CCC-SLP [phone]

## 2017-07-06 NOTE — Care Management Note (Signed)
Case Management Note  Patient Details  Name: Kendra Deleon MRN: [id number] Date of Birth: 1951/03/21  Subjective/Objective:   From home , pta indep, presents with  r occipital lobe late subacute intra -axial hemorrhage, htn, hld, migraine HA, for dc today.  No needs.                  Action/Plan:   Expected Discharge Date:  07/06/17               Expected Discharge Plan:  Home/Self Care  In-House Referral:     Discharge planning Services  CM Consult  Post Acute Care Choice:    Choice offered to:     DME Arranged:    DME Agency:     HH Arranged:    HH Agency:     Status of Service:  Completed, signed off  If discussed at [employer] of Stay Meetings, dates discussed:    Additional Comments:  Zenon Mayo, RN 07/06/2017, 3:17 PM

## 2017-07-06 NOTE — Discharge Summary (Signed)
DISCHARGE SUMMARY  SHANEIKA ROSSA  MR#: [id number]  DOB:[birth year]  Date of Admission: 07/05/2017 Date of Discharge: 07/06/2017  Attending Physician:Dewitte Vannice T  Patient's VOZ:[id number], Herbie [place], MD  Consults:  Neurology   Disposition: D/C home   Follow-up Appts: Follow-up Information    Garvin Fila, MD. Schedule an appointment as soon as possible for a visit in 2 week(s).   Specialties:  Neurology, Radiology Why:  call office on day of discharge to arrange 2 week hospital follow up Contact information: [address] [place] [address] [place] [place] Follow up.   Contact information: [place] [address] [phone]        Gaynelle Arabian, MD Follow up in 1 week(s).   Specialty:  Family Medicine Contact information: [address] [place] [address] [phone]          Discharge Diagnoses: R occipital lobe late subacute intra-axial hemorrhage HTN HLD Migraine HAs  Initial presentation: 66 y.o. female with history of uncontrolled HTN and HLD who presented to [facility] ED after being advised by her MD due to an abnormal brain MRI taken 07/05/17.  For 3 weeks she had been having intermittent migraines, and then lost her peripheral vision on the left. She denied dysphagia or motor sensory deficits. Stated BP had been uncontrolled since switching to different BP meds a few weeks prior.  Hospital Course:  R occipital lobe late subacute intra-axial hemorrhage Felt to be due to uncontrolled HTN - w/u completed by Neurology, who prescribed the following tx plan: -outpatient telemetry monitoring will be scheduled at follow up appointment with Dr Leonie Man in 2 weeks -HOLD aspirin for now due to [place]. Dr Leonie Man will decide timing to restart ASA at follow up appointment in 2 weeks. May need repeat CT in 2 weeks before resuming ASA.  -Outpatient OT for Vision loss -Patient has been  instructed NO driving until cleared by Opthalmology -Patient has been instructed to seek medical attention with any new or worsening symptoms or sudden headache. -Ongoing aggressive stroke risk factor management  Allergies as of 07/06/2017      Reactions   No Known Allergies       Medication List    STOP taking these medications   aspirin EC 325 MG tablet   aspirin-acetaminophen-caffeine 250-250-65 MG tablet Commonly known as:  EXCEDRIN MIGRAINE     TAKE these medications   amLODipine 5 MG tablet Commonly known as:  NORVASC Take 5 mg by mouth daily.   CALCIUM 600-D 600-400 MG-UNIT Tabs Generic drug:  Calcium Carbonate-Vitamin D3 Take 1 tablet by mouth daily.   fenofibrate 160 MG tablet Take 160 mg by mouth daily.   FLUoxetine 20 MG capsule Commonly known as:  PROZAC Take 20 mg by mouth daily.   ibuprofen 200 MG tablet Commonly known as:  ADVIL,MOTRIN Take 400 mg every 6 (six) hours as needed by mouth for headache or moderate pain.   pantoprazole 40 MG tablet Commonly known as:  PROTONIX Take 40 mg by mouth daily.   simvastatin 40 MG tablet Commonly known as:  ZOCOR Take 40 mg daily by mouth.   Vitamin D3 2000 units Tabs Take 2,000 Units by mouth daily.      Day of Discharge BP 124/72   Pulse 69   Temp 98.5 F (36.9 C) (Oral)   Resp 15   Ht 5\' 4"  (1.626 m)   Wt 64  kg (141 lb)   SpO2 99%   BMI 24.20 kg/m   Physical Exam: General: No acute respiratory distress Cardiovascular: Regular rate and rhythm without murmur gallop or rub normal S1 and S2  Basic Metabolic Panel: Recent Labs  Lab 07/05/17 1231 07/05/17 1242 07/06/17 0515  NA 136 139 139  K 4.1 4.1 4.2  CL 103 102 106  CO2 25  --  26  GLUCOSE 88 90 88  BUN 17 19 15   CREATININE 1.13* 1.10* 1.06*  CALCIUM 9.8  --  9.3    Liver Function Tests: Recent Labs  Lab 07/05/17 1231 07/06/17 0515  AST 26 24  ALT 16 16  ALKPHOS 54 48  BILITOT 0.7 0.6  PROT 7.1 6.0*  ALBUMIN 4.4 3.7     Coags: Recent Labs  Lab 07/05/17 1231  INR 1.07    CBC: Recent Labs  Lab 07/05/17 1231 07/05/17 1242 07/06/17 0515  WBC 7.6  --  5.9  NEUTROABS 5.1  --   --   HGB 12.2 13.6 11.3*  HCT 39.2 40.0 35.4*  MCV 77.3*  --  77.0*  PLT 352  --  275    Recent Results (from the past 240 hour(s))  MRSA PCR Screening     Status: None   Collection Time: 07/05/17  7:59 PM  Result Value Ref Range Status   MRSA by PCR NEGATIVE NEGATIVE Final    Comment:        The GeneXpert MRSA Assay (FDA approved for NASAL specimens only), is one component of a comprehensive MRSA colonization surveillance program. It is not intended to diagnose MRSA infection nor to guide or monitor treatment for MRSA infections.      Time spent in discharge (includes decision making & examination of pt): 30 minutes  07/06/2017, 2:23 PM   Cherene Altes, MD [facility] Office  [phone] Pager [phone]  On-Call/Text Page:      [email or web address]      password [facility]

## 2017-07-06 NOTE — Care Management Obs Status (Signed)
[place] NOTIFICATION   Patient Details  Name: MELAT WRISLEY MRN: [id number] Date of Birth: 02/02/1951   Medicare Observation Status Notification Given:  Yes    Zenon Mayo, RN 07/06/2017, 3:15 PM

## 2017-07-06 NOTE — Progress Notes (Signed)
Pt. given paper copy of discharge instructions. Discussed with patient the medications that she was to stop taking and reviewed the medications she is to continue taking. Instructed pt. to follow-up with primary care provider as well as consult appointments. Educated the pt. on her disease process, provided further stroke  education materials and answered any questions the pt. had. All of the pt.'s belongings were gathered and none were left behind. Pt. traveled via wheelchair to main entrance where the pt.'s daughter took her by car.

## 2017-07-06 NOTE — Telephone Encounter (Signed)
Thanks. Patient admitted to [facility] for w/u

## 2017-07-06 NOTE — Care Management CC44 (Signed)
Condition Code 44 Documentation Completed  Patient Details  Name: Kendra Deleon MRN: [id number] Date of Birth: [birth year]   Condition Code 44 given:  Yes Patient signature on Condition Code 44 notice:  Yes Documentation of 2 MD's agreement:  Yes Code 44 added to claim:  Yes    Zenon Mayo, RN 07/06/2017, 3:15 PM

## 2017-07-06 NOTE — Discharge Instructions (Signed)
Intracranial Hemorrhage  An intracranial hemorrhage is bleeding in the layers between the skull (cranium) and brain. A blood vessel bursts and allows blood to leak inside the cranial cavity. The leaking blood then collects (hematoma). This causes pressure and damage to brain cells. The bleeding can be mild to severe. In severe cases, it can lead to permanent damage or death. Symptoms may come on suddenly or develop over time. Early diagnosis and treatment leads to better recovery.  There are four types of intracranial hemorrhage: subarachnoid, subdural, extradural, or cerebral hemorrhage.  What are the causes?   Head injury (trauma).   Ruptured brain aneurysm.   Bleeding from blood vessels that develop abnormally (arteriovenous malformation).   Bleeding disorder.   Use of blood thinners (anticoagulants).   Use of certain drugs, such as cocaine.  For some people with intracranial hemorrhage, the cause is unknown.  What increases the risk?   Using tobacco products, such as cigarettes and chewing tobacco.   Having high blood pressure (hypertension).   Abusing alcohol.   Being a female, especially of postmenopausal age.   Having a family history of disease in the blood vessels of the brain (cerebrovascular disease).   Having certain genetic syndromes that result in kidney disease or connective tissue disease.  What are the signs or symptoms?   A sudden, severe headache with no known cause. The headache is often described as the worst headache ever experienced.   Nausea or vomiting, especially when combined with other symptoms such as a headache.   Sudden weakness or numbness of the face, arm, or leg, especially on one side of the body.   Sudden trouble walking or difficulty moving arms or legs.   Sudden confusion.   Sudden personality changes.   Trouble speaking (aphasia) or understanding.   Difficulty swallowing.   Sudden trouble seeing in one or both eyes.   Double vision.   Dizziness.   Loss  of balance or coordination.   Intolerance to light.   Stiff neck.  How is this diagnosed?  Your health care provider will perform a physical exam and ask about your symptoms. If an intracranial hemorrhage is suspected, various tests may be ordered. These tests may include:   A CT scan.   An MRI.   A cerebral angiogram.   A spinal tap (lumbar puncture).   Blood tests.    How is this treated?  Immediate treatment in the hospital is often required to reduce the risk of brain damage. Treatment will depend on the cause of the bleeding, where it is located, and the extent of the bleeding and damage. The goals of treatment include stopping the bleeding, repairing the cause of bleeding, providing relief of symptoms, and preventing problems.   Medicines may be given to:  ? Lower blood pressure (antihypertensives).  ? Relieve pain (analgesics).  ? Relieve nausea or vomiting.   Surgery may be needed to stop the bleeding, repair the cause of the bleeding, or remove the blood.   Rehabilitation may be needed to improve any cognitive and day-to-day functions impaired by the condition.    Further treatment depends on the duration, severity, and cause of your symptoms. Physical, speech, and occupational therapists will assess you and work to improve any functions impaired by the intracranial hemorrhage. Measures will be taken to prevent short-term and long-term problems, including infection from breathing foreign material into the lungs (aspiration pneumonia), blood clots in the legs, bedsores, and falls.  Follow these instructions at home:     Take medicines only as directed by your health care provider.   Eat healthy foods as directed by your health care provider:  ? A diet low in salt (sodium), saturated fat, trans fat, and cholesterol may be recommended to manage your blood pressure.  ? Foods may need to be soft or pureed, or small bites may need to be taken in order to avoid aspirating or choking.  ? If studies show  that your ability to swallow safely has been affected, you may need to seek help from specialists such as a dietitian, speech and language pathologist, or an occupational therapist. These health care providers can teach you how to safely get the nutrition your body needs.   Rest and limit activities or movements as directed by your health care provider.   Do not use any tobacco products including cigarettes, chewing tobacco, or electronic cigarettes. If you need help quitting, ask your health care provider.   Limit alcohol intake to no more than 1 drink per day for nonpregnant women and 2 drinks per day for men. One drink equals 12 ounces of beer, 5 ounces of wine, or 1 ounces of hard liquor.   Make any other lifestyle changes as directed by your health care provider.   Monitor and record your blood pressure as directed by your health care provider.   A safe home environment is important to reduce the risk of falls. Your health care provider may arrange for specialists to evaluate your home. Having grab bars in the bedroom and bathroom is often important. Your health care provider may arrange for special equipment to be used at home, such as raised toilets and a seat for the shower.   Do physical, occupational, and speech therapy as directed by your health care provider. Ongoing therapy may be needed to maximize your recovery.   Use a walker or a cane at all times if directed by your health care provider.   Keep all follow-up visits with your health care provider and other specialists. This is important. This includes any referrals, physical therapy, and rehabilitation.  Get help right away if:   You have a sudden, severe headache with no known cause.   You have nausea or vomiting occurring with another symptom.   You have sudden weakness or numbness of the face, arm, or leg, especially on one side of the body.   You have sudden trouble walking or difficulty moving your arms or legs.   You have sudden  confusion.   You have trouble speaking (aphasia) or understanding.   You have sudden trouble seeing in one or both eyes.   You have a sudden loss of balance or coordination.   You have a stiff neck.   You have difficulty breathing.   You have a partial or total loss of consciousness.  These symptoms may represent a serious problem that is an emergency. Do not wait to see if the symptoms will go away. Get medical help right away. Call your local emergency services (911 in the [place] Do not drive yourself to the hospital.  This information is not intended to replace advice given to you by your health care provider. Make sure you discuss any questions you have with your health care provider.  Document Released: 03/15/2014 Document Revised: 01/18/2016 Document Reviewed: 10/12/2013  Elsevier Interactive Patient Education  2018 [employer]

## 2017-07-16 DIAGNOSIS — I639 Cerebral infarction, unspecified: Secondary | ICD-10-CM | POA: Diagnosis not present

## 2017-07-16 DIAGNOSIS — I129 Hypertensive chronic kidney disease with stage 1 through stage 4 chronic kidney disease, or unspecified chronic kidney disease: Secondary | ICD-10-CM | POA: Diagnosis not present

## 2017-07-16 DIAGNOSIS — E7800 Pure hypercholesterolemia, unspecified: Secondary | ICD-10-CM | POA: Diagnosis not present

## 2017-07-20 DIAGNOSIS — H53462 Homonymous bilateral field defects, left side: Secondary | ICD-10-CM | POA: Diagnosis not present

## 2017-07-28 ENCOUNTER — Encounter: Payer: Self-pay | Admitting: Neurology

## 2017-07-28 ENCOUNTER — Ambulatory Visit (INDEPENDENT_AMBULATORY_CARE_PROVIDER_SITE_OTHER): Payer: Medicare Other | Admitting: Neurology

## 2017-07-28 VITALS — BP 122/66 | HR 69 | Wt 143.4 lb

## 2017-07-28 DIAGNOSIS — I611 Nontraumatic intracerebral hemorrhage in hemisphere, cortical: Secondary | ICD-10-CM

## 2017-07-28 DIAGNOSIS — E785 Hyperlipidemia, unspecified: Secondary | ICD-10-CM

## 2017-07-28 DIAGNOSIS — I10 Essential (primary) hypertension: Secondary | ICD-10-CM

## 2017-07-28 DIAGNOSIS — I63431 Cerebral infarction due to embolism of right posterior cerebral artery: Secondary | ICD-10-CM
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
## 2017-07-28 NOTE — Patient Instructions (Addendum)
-   continue crestor for cholesterol control - will repeat CT to see if blood absorbed. If so, we will let you know and to resume ASA 81mg  - No restriction for driving, but recommend to drive with family members on board for 2-3 times initially. If both parties feel comfortable of your driving, you can drive alone after. However, you are recommended to initially drive during the day not at night, no long distance and drive in familiar roads. - Follow up with your primary care physician for stroke risk factor modification. Recommend maintain blood pressure goal <130/80, diabetes with hemoglobin A1c goal below 7.0% and lipids with LDL cholesterol goal below 70 mg/dL.  - check BP at home and record. - follow up with Dr. Leonie Man as scheduled.

## 2017-08-04 ENCOUNTER — Ambulatory Visit
Admission: RE | Admit: 2017-08-04 | Discharge: 2017-08-04 | Disposition: A | Discharge status: Home / Self Care | Payer: Medicare Other | Source: Ambulatory Visit | Attending: Neurology | Admitting: Neurology

## 2017-08-04 DIAGNOSIS — I611 Nontraumatic intracerebral hemorrhage in hemisphere, cortical: Secondary | ICD-10-CM | POA: Diagnosis not present

## 2017-08-06 ENCOUNTER — Telehealth: Payer: Self-pay | Admitting: Neurology

## 2017-08-06 MED ORDER — ASPIRIN EC 81 MG PO TBEC
81.0000 mg | DELAYED_RELEASE_TABLET | Freq: Every day | ORAL | Status: DC
Start: 2017-08-06 — End: 2019-12-13

## 2017-08-06 NOTE — Telephone Encounter (Signed)
Called pt and relayed her CT head result which showed absorption of ICH. Told her able to resume ASA EC 81mg  daily. She stated that her BP controlled well and she has appointment with Dr. Leonie Man on 09/15/16 for follow up.   Rosalin Hawking, MD PhD Stroke Neurology 08/06/2017 12:46 PM  Meds ordered this encounter  Medications  . aspirin EC 81 MG tablet    Sig: Take 1 tablet (81 mg total) by mouth daily.

## 2017-08-06 NOTE — Telephone Encounter (Signed)
Pt request CT results. 

## 2017-08-06 NOTE — Telephone Encounter (Signed)
Thanks

## 2017-08-13 DIAGNOSIS — N183 Chronic kidney disease, stage 3 (moderate): Secondary | ICD-10-CM | POA: Diagnosis not present

## 2017-08-13 DIAGNOSIS — I129 Hypertensive chronic kidney disease with stage 1 through stage 4 chronic kidney disease, or unspecified chronic kidney disease: Secondary | ICD-10-CM | POA: Diagnosis not present

## 2017-08-13 DIAGNOSIS — E7800 Pure hypercholesterolemia, unspecified: Secondary | ICD-10-CM | POA: Diagnosis not present

## 2017-08-14 ENCOUNTER — Encounter: Payer: Self-pay | Admitting: Family Medicine

## 2017-08-14 DIAGNOSIS — H53462 Homonymous bilateral field defects, left side: Secondary | ICD-10-CM | POA: Diagnosis not present

## 2017-08-27 ENCOUNTER — Telehealth: Payer: Self-pay | Admitting: Neurology

## 2017-08-27 NOTE — Telephone Encounter (Signed)
Ok I will address this when I get back

## 2017-08-27 NOTE — Telephone Encounter (Signed)
Pt's husband said [facility] has sent a bill for DOS 06/24/17 stating the insurance denied stating it was not medically necessary. He has called [facility] and was advised they will need a letter of medical necessity sent to medicare and [place]. Pt is aware RN is out of the office this week and Dr Leonie Man will be back in the office 09/14/17.

## 2017-09-03 NOTE — Telephone Encounter (Signed)
Pt needs letter for medical necessity for date of service on 06/24/2017 from last visit at [facility]

## 2017-09-15 ENCOUNTER — Encounter: Payer: Self-pay | Admitting: Neurology

## 2017-09-15 ENCOUNTER — Ambulatory Visit (INDEPENDENT_AMBULATORY_CARE_PROVIDER_SITE_OTHER): Payer: Medicare Other | Admitting: Neurology

## 2017-09-15 VITALS — BP 124/71 | HR 71 | Wt 145.0 lb

## 2017-09-15 DIAGNOSIS — H53482 Generalized contraction of visual field, left eye: Secondary | ICD-10-CM
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
## 2017-09-15 NOTE — Patient Instructions (Signed)
I had a long d/w patient and her husband about her recent stroke,partial left sided peripheral vision loss, risk for recurrent stroke/TIAs, personally independently reviewed imaging studies and stroke evaluation results and answered questions.Continue aspirin 81 mg daily  for secondary stroke prevention and maintain strict control of hypertension with blood pressure goal below 130/90, diabetes with hemoglobin A1c goal below 6.5% and lipids with LDL cholesterol goal below 70 mg/dL. I also advised the patient to eat a healthy diet with plenty of whole grains, cereals, fruits and vegetables, exercise regularly and maintain ideal body weight . I encouraged her to be careful with her driving and particularly by changing lanes to the left. Followup in the future with my nurse practitioner in 6 months or call earlier if necessary

## 2017-09-15 NOTE — Telephone Encounter (Addendum)
Letter given to pt and husband. Pt was seen today by Dr. Leonie Man for a follow up. Pts husband did not know where to fax lab medical necessity letter to. The letter is for the pts insurance to pay for labs [place] order in [id number]. He will call [facility] back with a contact name, and number to fax letter to. THe pt and husband were given a copy for their records.

## 2017-11-20 DIAGNOSIS — K219 Gastro-esophageal reflux disease without esophagitis: Secondary | ICD-10-CM | POA: Diagnosis not present

## 2017-11-20 DIAGNOSIS — M5030 Other cervical disc degeneration, unspecified cervical region: Secondary | ICD-10-CM | POA: Diagnosis not present

## 2017-11-20 DIAGNOSIS — M899 Disorder of bone, unspecified: Secondary | ICD-10-CM | POA: Diagnosis not present

## 2017-11-20 DIAGNOSIS — E559 Vitamin D deficiency, unspecified: Secondary | ICD-10-CM | POA: Diagnosis not present

## 2017-11-20 DIAGNOSIS — F411 Generalized anxiety disorder: Secondary | ICD-10-CM | POA: Diagnosis not present

## 2017-11-20 DIAGNOSIS — Z1389 Encounter for screening for other disorder: Secondary | ICD-10-CM | POA: Diagnosis not present

## 2017-11-20 DIAGNOSIS — N183 Chronic kidney disease, stage 3 (moderate): Secondary | ICD-10-CM | POA: Diagnosis not present

## 2017-11-20 DIAGNOSIS — E7800 Pure hypercholesterolemia, unspecified: Secondary | ICD-10-CM | POA: Diagnosis not present

## 2017-11-20 DIAGNOSIS — Z8673 Personal history of transient ischemic attack (TIA), and cerebral infarction without residual deficits: Secondary | ICD-10-CM | POA: Diagnosis not present

## 2017-11-20 DIAGNOSIS — Z0000 Encounter for general adult medical examination without abnormal findings: Secondary | ICD-10-CM | POA: Diagnosis not present

## 2017-11-20 DIAGNOSIS — Z136 Encounter for screening for cardiovascular disorders: Secondary | ICD-10-CM | POA: Diagnosis not present

## 2017-11-20 DIAGNOSIS — I129 Hypertensive chronic kidney disease with stage 1 through stage 4 chronic kidney disease, or unspecified chronic kidney disease: Secondary | ICD-10-CM | POA: Diagnosis not present

## 2017-12-12 IMAGING — MR MR MRA HEAD W/O CM
11 of 18 series · 25 of 48 positions shown · IV contrast (multihance)
Comparison: Cervical spine MRI 11/01/2016. Brain MRI 05/28/2015.
Neck MRI 12/21/2013.

CLINICAL DATA: 66-year-old female with left him ominous
hemianopsia. Symptom onset 2 weeks ago, lost peripheral vision in
left eye. "Migraine headache" at that time. Suspected stroke.

Creatinine was obtained on site at [HOSPITAL] at [HOSPITAL]. Results: Creatinine
1.0 mg/dL.
EXAM:
MRI HEAD WITHOUT AND WITH CONTRAST
MRA HEAD WITHOUT CONTRAST
MRA NECK WITHOUT AND WITH CONTRAST
TECHNIQUE: Multiplanar, multiecho pulse sequences of the brain and surrounding
structures were obtained without and with intravenous contrast.
Angiographic images of the Circle of Willis were obtained using MRA
technique without intravenous contrast. Angiographic images of the
neck were obtained using MRA technique without and with intravenous
contrast. Carotid stenosis measurements (when applicable) are
obtained utilizing NASCET criteria, using the distal internal
carotid diameter as the denominator.
CONTRAST:  17 mL MultiHance

[Series 2: T1 · sagittal · 5.0mm · 0.45mm/px · 2 of 23 slices shown]
[im 1/23]
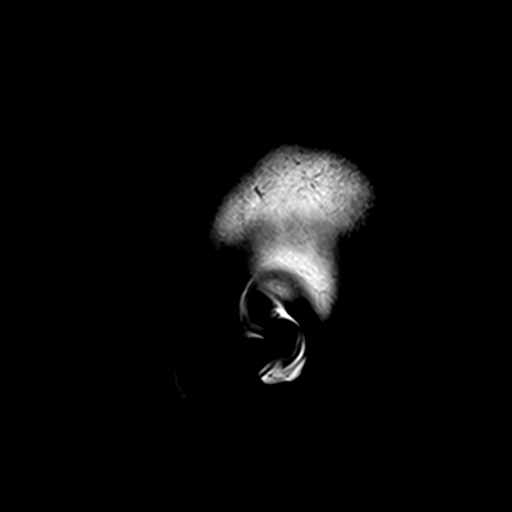
[im 23/23]
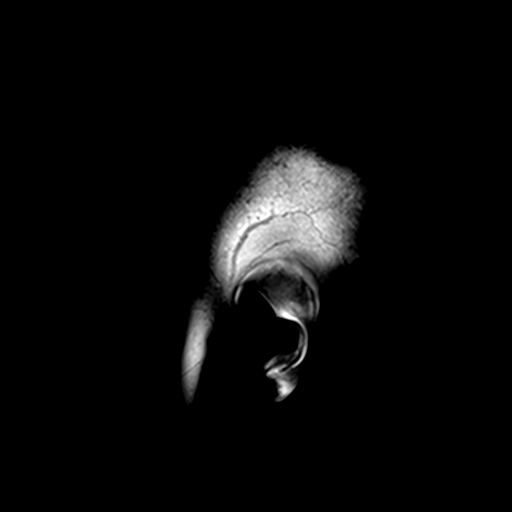

[Series 4: tof_3d_multi-slab · axial · 0.7mm · 0.39mm/px · z∈[-93,+20]mm · 8 of 162 slices shown]
[im 1/162]
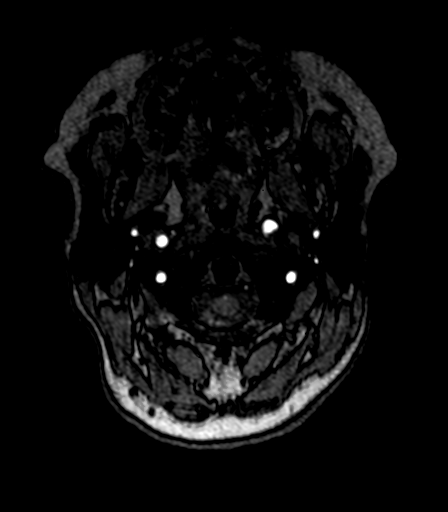
[im 24/162]
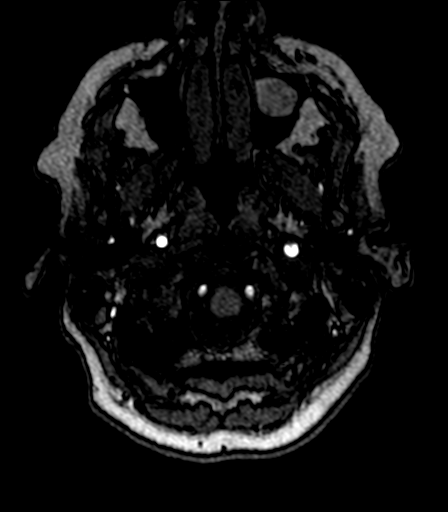
[im 47/162]
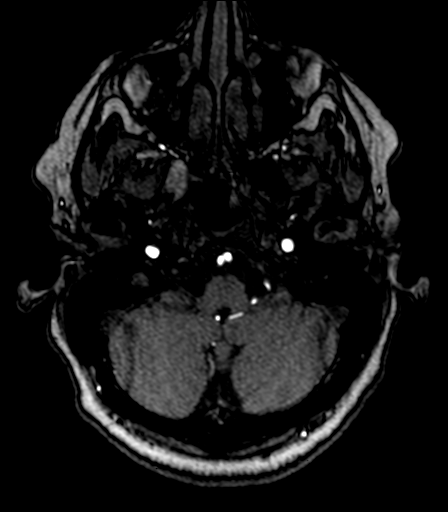
[im 70/162]
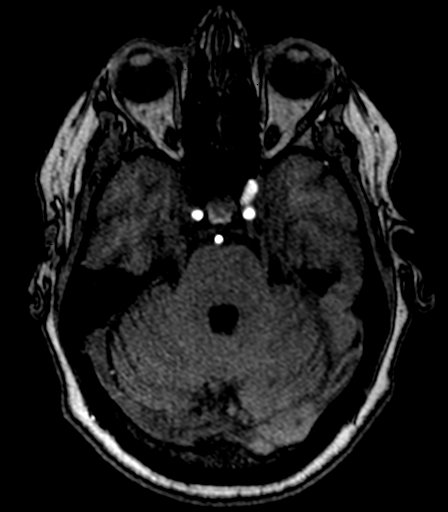
[im 93/162]
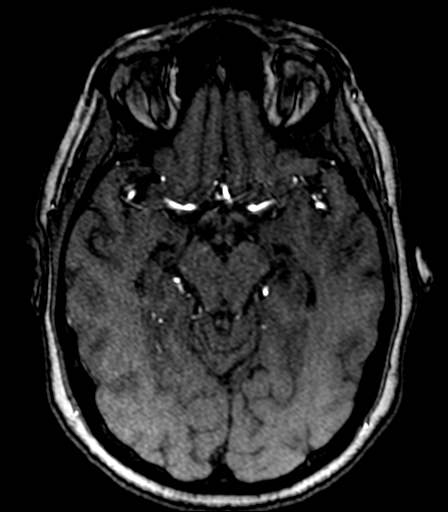
[im 116/162]
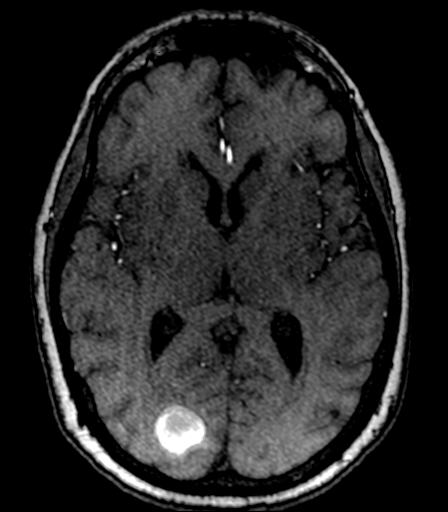
[im 139/162]
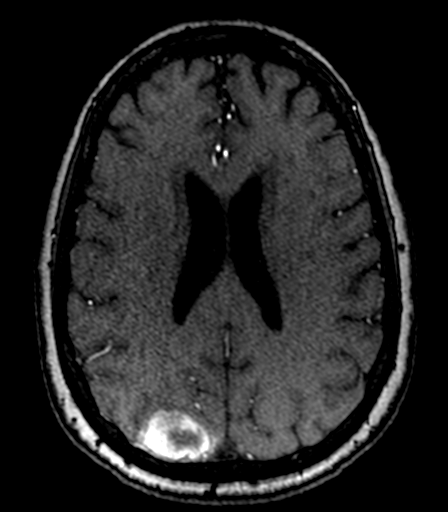
[im 162/162]
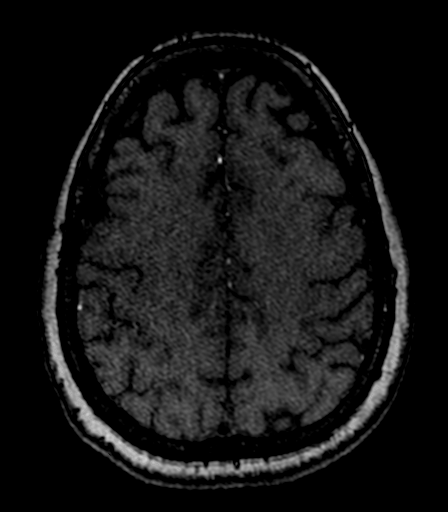

[Series 8: DWI · axial · 5.0mm · 0.90mm/px · z∈[-85,+58]mm · 2 of 46 slices shown (1 of 3)]
[im 1/46]
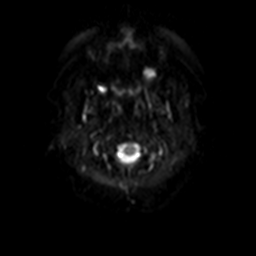
[im 46/46]
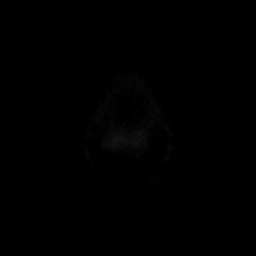

[Series 9: dwi_adc · axial · 5.0mm · 0.90mm/px · 1 of 23 slices shown]
[im 1/23]
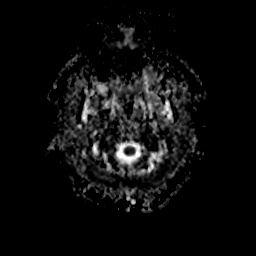

[Series 10: T2 · axial · 5.0mm · 0.45mm/px · 1 of 24 slices shown (1 of 2)]
[im 1/24]
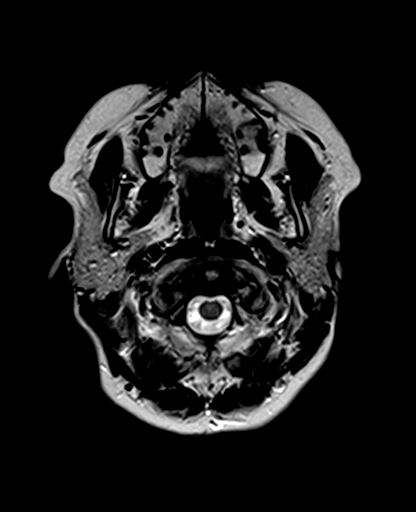

[Series 11: DWI · coronal · 5.0mm · 1.80mm/px · 3 of 72 slices shown (2 of 3)]
[im 1/72]
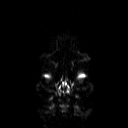
[im 36/72]
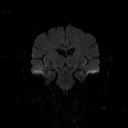
[im 72/72]
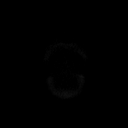

[Series 12: DWI · coronal · 5.0mm · 1.80mm/px · 2 of 36 slices shown (3 of 3)]
[im 1/36]
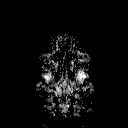
[im 36/36]
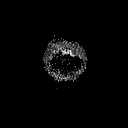

[Series 13: FLAIR · axial · 5.0mm · 0.45mm/px · 1 of 24 slices shown]
[im 1/24]
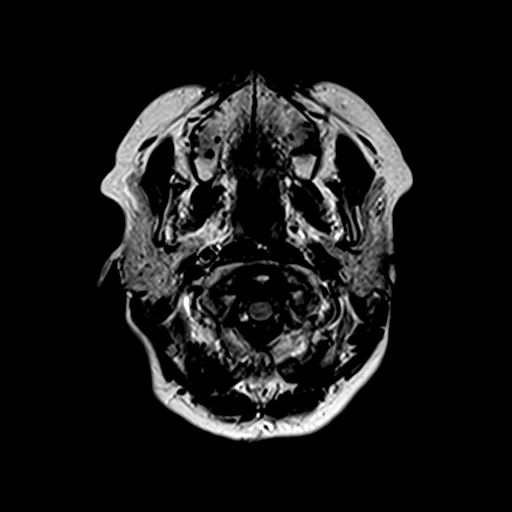

[Series 15: axial (person_name) · axial · 2.0mm · 0.45mm/px · z∈[-93,+11]mm · 3 of 80 slices shown]
[im 1/80]
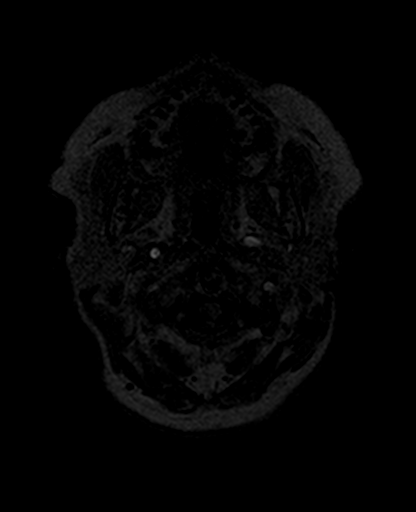
[im 27/80]
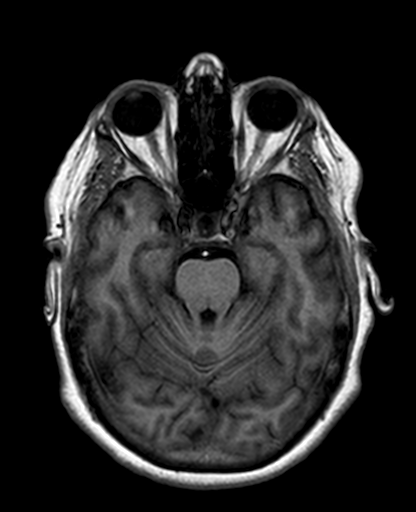
[im 53/80]
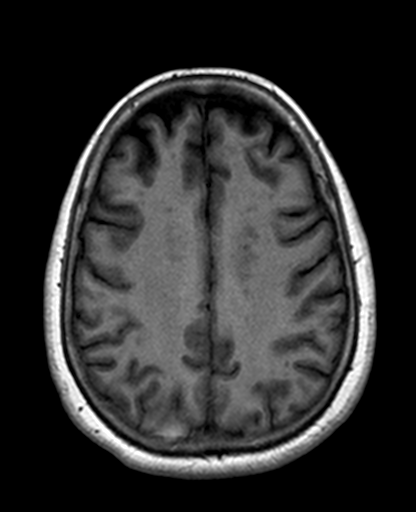

[Series 18: T2 · coronal · 5.0mm · 0.45mm/px · 1 of 29 slices shown (2 of 2)]
[im 1/29]
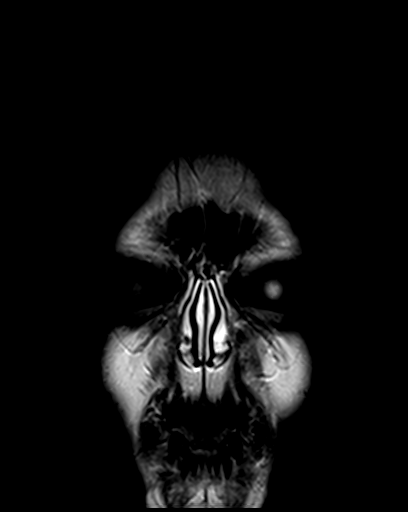

[Series 42: T1 post-contrast · coronal · 5.0mm · 0.45mm/px · 1 of 29 slices shown]
[im 1/29]
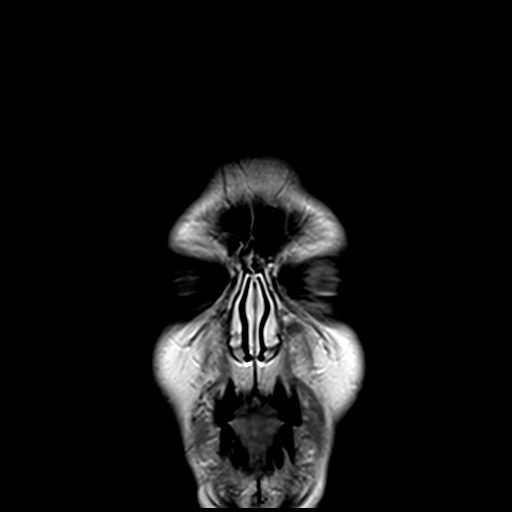

[25 of 48 positions shown; findings below may reference images not displayed]

FINDINGS: MRI HEAD FINDINGS

Brain: 21 x 24 x 34 mm T1 and FLAIR hyperintense and mixed intrinsic
T1 signal rounded mass in the superior right occipital lobe is most
compatible with intra-axial hemorrhage. Estimated blood volume 9 mL.
Associated restricted diffusion of the blood products, but no
surrounding diffusion restriction elsewhere in the right PCA
territory. Minimal regional mass effect. Mild surrounding FLAIR and
T2 hyperintensity tracking toward the right lateral ventricle
(Series 13, image 14). No intraventricular or extra-axial hemorrhage
identified. Following contrast, no abnormal enhancement.

There are multiple small chronic micro hemorrhages elsewhere
including the left MCA territory (series 17, image 21) and also in
the contralateral left occipital lobe (image 16) an these may have
progressed since 1481 (susceptibility weighted imaging today but not
on the prior study).

Patchy and scattered bilateral cerebral white matter T2 and FLAIR
hyperintensity has mildly progressed in both hemispheres since 1481.
No cortical encephalomalacia identified.

No other restricted diffusion. No midline shift, ventriculomegaly,
or extra-axial fluid. Cervicomedullary junction and pituitary are
within normal limits.

Vascular: Major intracranial vascular flow voids are stable since
1481. The major dural venous sinuses are enhancing and appear to be
patent.

Skull and upper cervical spine: Negative aside from possible ACDF
hardware artifact along the inferior C3 endplate. Normal bone marrow
signal.

Sinuses/Orbits: Stable.

Other: Mastoid air cells are clear. Visible internal auditory
structures appear normal. Scalp and face soft tissues appear
negative.

MRA NECK FINDINGS

Precontrast time-of-flight images reveal antegrade flow in both
carotid and vertebral arteries in the neck to the skullbase. Both
carotid bifurcations are patent.

Post-contrast neck MRA images reveal a 3 vessel arch configuration.
No great vessel origin stenosis. Tortuous proximal CCA is.

Otherwise negative left CCA, left carotid bifurcation and cervical
left ICA. There is irregularity along the medial right ICA origin
with less than 50 % stenosis with respect to the distal vessel.
Otherwise negative cervical right ICA.

No proximal subclavian artery stenosis. Normal vertebral artery
origins. Codominant vertebral arteries are patent to the skullbase
without stenosis.

MRA HEAD FINDINGS

Antegrade flow in the posterior circulation with codominant distal
vertebral arteries. Normal PICA origins. No distal vertebral
stenosis. Patent vertebrobasilar junction and basilar artery without
stenosis. Normal SCA and PCA origins. Posterior communicating
arteries are present. Bilateral PCA branches are within normal
limits ; minimal mass effect on the distal most right P3 segments.

Antegrade flow in both ICA siphons. No siphon stenosis. Ophthalmic
and posterior communicating artery origins are normal. Normal
carotid termini. Normal MCA and ACA origins. Anterior communicating
artery and visible ACA branches are within normal limits. MCA M1
segments, MCA bifurcations, and visible bilateral MCA branches are
within normal limits.
IMPRESSION: 1. Late subacute appearing intra-axial hemorrhage in the superior
right occipital lobe with estimated blood volume 9 mL. Mild
surrounding edema and minimal regional mass effect.
2. Multiple underlying chronic micro hemorrhages in both the left
PCA and left MCA territories. Suspect chronic small vessel disease
such as due to hypertension is the etiology of the hemorrhage in #1.
3. Superimposed moderate for age cerebral white matter changes with
mild progression since 1481, likely also small vessel related.
4. Negative neck MRA aside from mild atherosclerosis at the right
ICA origin, no stenosis.
5.  Negative intracranial MRA.
The Brain MRI findings were discussed by telephone with the patient
and her husband at 7782 hours on 07/05/2017.
I advised them to report to the [REDACTED] for further
evaluation and treatment planning.
I also briefly discussed the patient and her MRI findings with the
[REDACTED] Charge Nurse Ivandel at 9897 hours.

## 2017-12-25 DIAGNOSIS — S129XXA Fracture of neck, unspecified, initial encounter: Secondary | ICD-10-CM | POA: Diagnosis not present

## 2017-12-25 DIAGNOSIS — M542 Cervicalgia: Secondary | ICD-10-CM | POA: Diagnosis not present

## 2017-12-25 DIAGNOSIS — I10 Essential (primary) hypertension: Secondary | ICD-10-CM | POA: Diagnosis not present

## 2017-12-25 DIAGNOSIS — N189 Chronic kidney disease, unspecified: Secondary | ICD-10-CM | POA: Diagnosis not present

## 2017-12-25 DIAGNOSIS — Z981 Arthrodesis status: Secondary | ICD-10-CM | POA: Diagnosis not present

## 2017-12-29 DIAGNOSIS — K648 Other hemorrhoids: Secondary | ICD-10-CM | POA: Diagnosis not present

## 2017-12-29 DIAGNOSIS — Z1211 Encounter for screening for malignant neoplasm of colon: Secondary | ICD-10-CM | POA: Diagnosis not present

## 2017-12-29 DIAGNOSIS — Z86010 Personal history of colonic polyps: Secondary | ICD-10-CM | POA: Diagnosis not present

## 2018-01-06 ENCOUNTER — Telehealth: Payer: Self-pay | Admitting: Neurology

## 2018-01-06 NOTE — Telephone Encounter (Signed)
Dr. Leonie Man spoke with patient about not wanting to sit in car for 3 to 5 days. Pts PCP has been handling her anxiety, and stress level with medications for this court issue. PEr Dr.Sethi from his standpoint he cannot justify her not being at court. Dr. Leonie Man relayed to patient he will defer pt back to her PCP to handle this issuenot wanting to be in court because of the stress levels. Pt verbalized understanding.
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
## 2018-01-06 NOTE — Telephone Encounter (Signed)
Based on my last assesssment in office f/u visit I thank she can stay in courtroom for 3-5 days

## 2018-01-06 NOTE — Telephone Encounter (Signed)
Message sent to Dr. Leonie Man to advise on.
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
## 2018-02-24 DIAGNOSIS — L820 Inflamed seborrheic keratosis: Secondary | ICD-10-CM | POA: Diagnosis not present

## 2018-02-24 DIAGNOSIS — D229 Melanocytic nevi, unspecified: Secondary | ICD-10-CM | POA: Diagnosis not present

## 2018-03-10 DIAGNOSIS — H53462 Homonymous bilateral field defects, left side: Secondary | ICD-10-CM | POA: Diagnosis not present
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
## 2018-03-16 ENCOUNTER — Encounter: Payer: Self-pay | Admitting: Nurse Practitioner

## 2018-03-16 ENCOUNTER — Ambulatory Visit (INDEPENDENT_AMBULATORY_CARE_PROVIDER_SITE_OTHER): Payer: Medicare Other | Admitting: Nurse Practitioner

## 2018-03-16 VITALS — BP 131/78 | HR 71 | Ht 64.0 in | Wt 145.8 lb

## 2018-03-16 DIAGNOSIS — I10 Essential (primary) hypertension: Secondary | ICD-10-CM

## 2018-03-16 DIAGNOSIS — I63431 Cerebral infarction due to embolism of right posterior cerebral artery: Secondary | ICD-10-CM

## 2018-03-16 DIAGNOSIS — E785 Hyperlipidemia, unspecified: Secondary | ICD-10-CM | POA: Diagnosis not present

## 2018-03-16 NOTE — Patient Instructions (Signed)
Continue aspirin for secondary stroke prevention Maintain strict control of hypertension with blood pressure goal below 130/90 today's reading 131/78 Diabetes with hemoglobin A1c below 6.5 LDL cholesterol below 70 continue Crestor  continue to exercise Healthy diet Follow-up in 6 months if stable will discharge

## 2018-03-20 NOTE — Progress Notes (Signed)
I agree with the above plan 

## 2018-05-05 DIAGNOSIS — Z23 Encounter for immunization: Secondary | ICD-10-CM | POA: Diagnosis not present

## 2018-05-11 DIAGNOSIS — E559 Vitamin D deficiency, unspecified: Secondary | ICD-10-CM | POA: Diagnosis not present

## 2018-05-11 DIAGNOSIS — K219 Gastro-esophageal reflux disease without esophagitis: Secondary | ICD-10-CM | POA: Diagnosis not present

## 2018-05-11 DIAGNOSIS — M899 Disorder of bone, unspecified: Secondary | ICD-10-CM | POA: Diagnosis not present

## 2018-05-11 DIAGNOSIS — M5030 Other cervical disc degeneration, unspecified cervical region: Secondary | ICD-10-CM | POA: Diagnosis not present

## 2018-05-11 DIAGNOSIS — F411 Generalized anxiety disorder: Secondary | ICD-10-CM | POA: Diagnosis not present

## 2018-05-11 DIAGNOSIS — N183 Chronic kidney disease, stage 3 (moderate): Secondary | ICD-10-CM | POA: Diagnosis not present

## 2018-05-11 DIAGNOSIS — E7800 Pure hypercholesterolemia, unspecified: Secondary | ICD-10-CM | POA: Diagnosis not present

## 2018-05-11 DIAGNOSIS — I129 Hypertensive chronic kidney disease with stage 1 through stage 4 chronic kidney disease, or unspecified chronic kidney disease: Secondary | ICD-10-CM | POA: Diagnosis not present

## 2018-05-11 DIAGNOSIS — Z8673 Personal history of transient ischemic attack (TIA), and cerebral infarction without residual deficits: Secondary | ICD-10-CM | POA: Diagnosis not present

## 2018-06-06 ENCOUNTER — Emergency Department (HOSPITAL_COMMUNITY)
Admission: EM | Admit: 2018-06-06 | Discharge: 2018-06-06 | Disposition: A | Discharge status: Home / Self Care | Payer: Medicare Other | Attending: Emergency Medicine | Admitting: Emergency Medicine

## 2018-06-06 ENCOUNTER — Other Ambulatory Visit: Payer: Self-pay

## 2018-06-06 ENCOUNTER — Emergency Department (HOSPITAL_COMMUNITY): Payer: Medicare Other

## 2018-06-06 ENCOUNTER — Encounter (HOSPITAL_COMMUNITY): Payer: Self-pay

## 2018-06-06 DIAGNOSIS — M79641 Pain in right hand: Secondary | ICD-10-CM

## 2018-06-06 DIAGNOSIS — S6991XA Unspecified injury of right wrist, hand and finger(s), initial encounter: Secondary | ICD-10-CM | POA: Insufficient documentation

## 2018-06-06 DIAGNOSIS — Y9301 Activity, walking, marching and hiking: Secondary | ICD-10-CM | POA: Insufficient documentation

## 2018-06-06 DIAGNOSIS — Y998 Other external cause status: Secondary | ICD-10-CM | POA: Insufficient documentation

## 2018-06-06 DIAGNOSIS — N183 Chronic kidney disease, stage 3 (moderate): Secondary | ICD-10-CM | POA: Insufficient documentation

## 2018-06-06 DIAGNOSIS — Y92254 Theater (live) as the place of occurrence of the external cause: Secondary | ICD-10-CM | POA: Diagnosis not present

## 2018-06-06 DIAGNOSIS — W010XXA Fall on same level from slipping, tripping and stumbling without subsequent striking against object, initial encounter: Secondary | ICD-10-CM | POA: Insufficient documentation

## 2018-06-06 DIAGNOSIS — M79644 Pain in right finger(s): Secondary | ICD-10-CM | POA: Diagnosis not present

## 2018-06-06 DIAGNOSIS — M25531 Pain in right wrist: Secondary | ICD-10-CM | POA: Diagnosis not present

## 2018-06-06 DIAGNOSIS — I129 Hypertensive chronic kidney disease with stage 1 through stage 4 chronic kidney disease, or unspecified chronic kidney disease: Secondary | ICD-10-CM | POA: Diagnosis not present

## 2018-06-06 NOTE — ED Notes (Signed)
Pt to xray

## 2018-06-06 NOTE — ED Triage Notes (Signed)
Pt states she went to go to a play last night and tripped going up a stair. Pt states she went to brace herself, and has had pain in her right hand since.

## 2018-06-06 NOTE — ED Provider Notes (Signed)
[place] DEPT Provider Note   CSN: [id number] Arrival date & time: 06/06/18  0900     History   Chief Complaint Chief Complaint  Patient presents with  . Fall  . Hand Pain    right    HPI Kendra Deleon is a 67 y.o. female.  67 year old female with past Legrand Como history below who presents with right hand pain after a fall.  Last night she was walking to the theater when she tripped on some steps and fell onto her hands.  She began having right hand pain predominantly around her right thumb that is constant, moderate, and worse with movement.  She has applied ice.  No other injuries from the fall.  She is right-handed.  Normal sensation.  The history is provided by the patient.  Fall   Hand Pain     Past Medical History:  Diagnosis Date  . Chronic kidney disease    stage 3 at last physical  . Depression   . GERD (gastroesophageal reflux disease)   . Hyperlipidemia   . Hypertension   . PONV (postoperative nausea and vomiting)    No problem with newwer anesthesia  . Stroke [facility])     Patient Active Problem List   Diagnosis Date Noted  . Nontraumatic cortical hemorrhage of right cerebral hemisphere ([place]) 07/28/2017  . CVA (cerebral vascular accident) ([place]) 07/06/2017  . CVA (cerebrovascular accident) ([place]) 07/05/2017  . HNP (herniated nucleus pulposus), cervical 11/13/2016  . Normal coronary arteries 06/26/2015  . Elevated blood sugar 06/26/2015  . Family history of coronary artery disease 06/26/2015  . Diastolic dysfunction 54/00/8676  . Atypical chest pain   . Abnormal nuclear stress test   . Chest pain 05/28/2015  . Depression 05/28/2015  . Dyslipidemia 05/28/2015  . Essential hypertension 05/28/2015  . Normocytic anemia 05/28/2015  . GERD (gastroesophageal reflux disease) 05/28/2015  . Hyperlipidemia 05/28/2015  . Paresthesia 05/28/2015  . Headache   . Paresthesias     Past Surgical History:  Procedure Laterality Date    . achilles tendon rupture Left    spurs  . ANTERIOR CERVICAL DECOMP/DISCECTOMY FUSION N/A 11/13/2016   Procedure: CERVICAL FOUR-FIVE, CERVICAL FIVE-SIX, CERVICAL SIX-SEVEN ANTERIOR CERVICAL DECOMPRESSION/DISCECTOMY FUSION;  Surgeon: Jovita Gamma, MD;  Location: [place];  Service: Neurosurgery;  Laterality: N/A;  . BREAST REDUCTION SURGERY    . CARDIAC CATHETERIZATION N/A 05/29/2015   Procedure: Left Heart Cath and Coronary Angiography;  Surgeon: Jettie Booze, MD;  Location: [place] [facility];  Service: Cardiovascular;  Laterality: N/A;  . COLONOSCOPY W/ POLYPECTOMY    . OOPHORECTOMY    . right knee menisectomy  Right   . TENNIS ELBOW RELEASE/NIRSCHEL PROCEDURE Right   . TONSILLECTOMY     age 24  . TUBAL LIGATION  1980     OB History   None      Home Medications    Prior to Admission medications   Medication Sig Start Date End Date Taking? Authorizing Provider  amLODipine (NORVASC) 10 MG tablet  07/16/17   [provider]  aspirin EC 81 MG tablet Take 1 tablet (81 mg total) by mouth daily. 08/06/17   Rosalin Hawking, MD  Calcium Carbonate-Vitamin D3 (CALCIUM 600-D) 600-400 MG-UNIT TABS Take 1 tablet by mouth daily.    [provider]  Cholecalciferol (VITAMIN D3) 2000 UNITS TABS Take 2,000 Units by mouth daily.     [provider]  fenofibrate 160 MG tablet Take 160 mg by mouth daily.  05/17/15   [provider]  FLUoxetine (PROZAC) 40 MG capsule 40 mg daily. 03/05/18   [provider]  ibuprofen (ADVIL,MOTRIN) 200 MG tablet Take 400 mg every 6 (six) hours as needed by mouth for headache or moderate pain.    [provider]  pantoprazole (PROTONIX) 40 MG tablet Take 40 mg by mouth daily.  04/28/15   [provider]  rosuvastatin (CRESTOR) 40 MG tablet  07/16/17   [provider]    Family History Family History  Problem Relation Age of Onset  . Hypertension Mother   . Hyperlipidemia Mother   . Coronary  artery disease Father        died at 72 from MI  . Diabetes Mellitus I Father        father had juvenile diabetes  . Breast cancer Sister   . Heart attack Maternal Grandfather   . Heart attack Paternal Grandfather   . Stroke Maternal Grandmother   . Stroke Paternal Grandmother     Social History Social History   Tobacco Use  . Smoking status: Never Smoker  . Smokeless tobacco: Never Used  Substance Use Topics  . Alcohol use: No    Alcohol/week: 0.0 standard drinks  . Drug use: No     Allergies   No known allergies   Review of Systems Review of Systems  Musculoskeletal: Positive for joint swelling.  Skin: Negative for wound.  Neurological: Negative for numbness.     Physical Exam Updated Vital Signs BP (!) 149/66   Pulse 63   Temp 98.2 F (36.8 C) (Oral)   Resp 16   Ht 5\' 3"  (1.6 m)   Wt 63.5 kg   SpO2 93%   BMI 24.80 kg/m   Physical Exam  Constitutional: She is oriented to person, place, and time. She appears well-developed and well-nourished. No distress.  HENT:  Head: Normocephalic and atraumatic.  Eyes: Conjunctivae are normal.  Neck: Neck supple.  Musculoskeletal: She exhibits edema and tenderness. She exhibits no deformity.  R arm: normal ROM at elbow and wrist; edema and generalized tenderness of thumb with decreased ROM at MCP joint due to swelling and pain; mild scaphoid tenderness  Neurological: She is alert and oriented to person, place, and time. No sensory deficit.  Skin: Skin is warm and dry.  Psychiatric: She has a normal mood and affect. Judgment normal.  Nursing note and vitals reviewed.    ED Treatments / Results  Labs (all labs ordered are listed, but only abnormal results are displayed) Labs Reviewed - No data to display  EKG None  Radiology Dg Wrist Complete Right  Result Date: 06/06/2018 CLINICAL DATA:  Right hand and wrist pain centered about the thumb and index finger due to an injury going up stairs last night. Initial  encounter. EXAM: RIGHT WRIST - COMPLETE 3+ VIEW COMPARISON:  None. FINDINGS: No acute bony or joint abnormality is identified. Mild degenerative changes seen at the first [facility] joint. No chondrocalcinosis. Soft tissues appear normal. IMPRESSION: No acute abnormality. Electronically Signed   By: Inge Rise M.D.   On: 06/06/2018 09:51   Dg Hand Complete Right  Result Date: 06/06/2018 CLINICAL DATA:  Right hand and wrist pain centered about the thumb and index finger due to an injury going up stairs last night. Initial encounter. EXAM: RIGHT HAND - COMPLETE 3+ VIEW COMPARISON:  None. FINDINGS: No acute bony or joint abnormality is identified. 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 Soft tissues are unremarkable. IMPRESSION: No acute abnormality. Electronically Signed   By: Inge Rise M.D.   On: 06/06/2018 09:51    Procedures Procedures (including critical care time)  Medications Ordered in ED Medications - No data to display   Initial Impression / Assessment and Plan / ED Course  I have reviewed the triage vital signs and the nursing notes.  Pertinent imaging results that were available during my care of the patient were reviewed by me and considered in my medical decision making (see chart for details).    Mild swelling and diffuse tenderness of thumb, mild scaphoid tenderness. Given this, placed in thumb spica splint although XR negative for acute fx. Recommended follow-up with PCP in 1 week for repeat films if persistent symptoms to rule out occult scaphoid fracture.  Discussed supportive measures including ice and elevation.  Final Clinical Impressions(s) / ED Diagnoses   Final diagnoses:  Right hand pain    ED Discharge Orders    None       Gearline Spilman, Wenda Overland, MD 06/06/18 1037

## 2018-06-06 NOTE — ED Notes (Signed)
Ortho tech does not come in until 1200. Pt will be on standby until they arrive

## 2018-06-06 NOTE — ED Notes (Signed)
Pt returned from xray Ice applied to right hand/wrist

## 2018-06-15 ENCOUNTER — Ambulatory Visit (INDEPENDENT_AMBULATORY_CARE_PROVIDER_SITE_OTHER): Payer: Medicare Other | Admitting: Orthopaedic Surgery

## 2018-06-15 ENCOUNTER — Encounter (INDEPENDENT_AMBULATORY_CARE_PROVIDER_SITE_OTHER): Payer: Self-pay | Admitting: Orthopaedic Surgery

## 2018-06-15 VITALS — BP 134/77 | HR 66 | Ht 64.0 in | Wt 140.0 lb

## 2018-06-15 DIAGNOSIS — I63431 Cerebral infarction due to embolism of right posterior cerebral artery: Secondary | ICD-10-CM

## 2018-06-15 DIAGNOSIS — M79641 Pain in right hand: Secondary | ICD-10-CM

## 2018-06-15 NOTE — Progress Notes (Signed)
Office Visit Note   Patient: Kendra Deleon           Date of Birth: 1951-04-10           MRN: [id number] Visit Date: 06/15/2018              Requested by: Kendra Arabian, MD [address] [place], [place] [address] PCP: Kendra Arabian, MD   Assessment & Plan: Visit Diagnoses:  1. 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  Continue with thumb spica splint for up to 4 weeks.  Return if necessary  Follow-Up Instructions: Return if symptoms worsen or fail to improve.   Orders:  No orders of the defined types were placed in this encounter.  No orders of the defined types were placed in this encounter.     Procedures: No procedures performed   Clinical Data: No additional findings.   Subjective: Chief Complaint  Patient presents with  . Follow-up    R HAND/THUMB PAIN FOR 10 DAYS AGO PT FELL GOING UP STAIRS AND CAUGHT SELF WITH HANDS, WENT TO ER AND WAS TOLD TO F/U   Kendra Deleon injured her right thumb approximately 10 days ago.  She fell breaking her fall with an outstretched right hand.  She had immediate onset of pain in her thumb.  She was in in the [facility] with x-rays demonstrating possible fracture.  She was placed in a thumb spica splint.  She is been fairly comfortable.  No numbness or tingling. I did review her films and felt that there were some arthritic changes at both of the above joints.  Slight volar subluxation at the metacarpal phalangeal joint.  No fracture identified  HPI  Review of Systems  Constitutional: Positive for fatigue. Negative for fever.  HENT: Negative for ear pain.   Eyes: Negative for pain.  Respiratory: Negative for cough and shortness of breath.   Cardiovascular: Negative for leg swelling.  Gastrointestinal: Negative for constipation and diarrhea.  Genitourinary: Negative for difficulty urinating.    Musculoskeletal: Negative for back pain and neck pain.  Skin: Negative for rash.  Allergic/Immunologic: Negative for food allergies.  Neurological: Positive for weakness. Negative for numbness.  Hematological: Does not bruise/bleed easily.  Psychiatric/Behavioral: Negative for sleep disturbance.     Objective: Vital Signs: BP 134/77 (BP Location: Left Arm, Patient Position: Sitting, Cuff Size: Normal)   Pulse 66   Ht 5\' 4"  (1.626 m)   Wt 140 lb (63.5 kg)   BMI 24.03 kg/m   Physical Exam  Constitutional: She is oriented to person, place, and time. She appears well-developed and well-nourished.  HENT:  Mouth/Throat: Oropharynx is clear and moist.  Eyes: Pupils are equal, round, and reactive to light. EOM are normal.  Pulmonary/Chest: Effort normal.  Neurological: She is alert and oriented to person, place, and time.  Skin: Skin is warm and dry.  Psychiatric: She has a normal mood and affect. Her behavior is normal.    Ortho Exam awake alert and oriented x3.  Comfortable sitting.  Right thumb with some tenderness at the metacarpal phalangeal joint carpal joint.  May be slight subluxation volarly at the metacarpal phalangeal joint.  No ecchymosis.  Minimal swelling.  Neurovascular exam intact.  No obvious instability  Specialty Comments:  No specialty comments available.  Imaging: No results found.   PMFS History: Patient Active Problem List  Diagnosis Date Noted  . Nontraumatic cortical hemorrhage of right cerebral hemisphere ([place]) 07/28/2017  . CVA (cerebral vascular accident) ([place]) 07/06/2017  . CVA (cerebrovascular accident) ([place]) 07/05/2017  . HNP (herniated nucleus pulposus), cervical 11/13/2016  . Normal coronary arteries 06/26/2015  . Elevated blood sugar 06/26/2015  . Family history of coronary artery disease 06/26/2015  . Diastolic dysfunction 09/98/3382  . Atypical chest pain   . Abnormal nuclear stress test   . Chest pain 05/28/2015  . Depression  05/28/2015  . Dyslipidemia 05/28/2015  . Essential hypertension 05/28/2015  . Normocytic anemia 05/28/2015  . GERD (gastroesophageal reflux disease) 05/28/2015  . Hyperlipidemia 05/28/2015  . Paresthesia 05/28/2015  . Headache   . Paresthesias    Past Medical History:  Diagnosis Date  . Chronic kidney disease    stage 3 at last physical  . Depression   . GERD (gastroesophageal reflux disease)   . Hyperlipidemia   . Hypertension   . PONV (postoperative nausea and vomiting)    No problem with newwer anesthesia  . Stroke [facility])     Family History  Problem Relation Age of Onset  . Hypertension Mother   . Hyperlipidemia Mother   . Coronary artery disease Father        died at 24 from MI  . Diabetes Mellitus I Father        father had juvenile diabetes  . Breast cancer Sister   . Heart attack Maternal Grandfather   . Heart attack Paternal Grandfather   . Stroke Maternal Grandmother   . Stroke Paternal Grandmother     Past Surgical History:  Procedure Laterality Date  . achilles tendon rupture Left    spurs  . ANTERIOR CERVICAL DECOMP/DISCECTOMY FUSION N/A 11/13/2016   Procedure: CERVICAL FOUR-FIVE, CERVICAL FIVE-SIX, CERVICAL SIX-SEVEN ANTERIOR CERVICAL DECOMPRESSION/DISCECTOMY FUSION;  Surgeon: Kendra Gamma, MD;  Location: [facility];  Service: Neurosurgery;  Laterality: N/A;  . BREAST REDUCTION SURGERY    . CARDIAC CATHETERIZATION N/A 05/29/2015   Procedure: Left Heart Cath and Coronary Angiography;  Surgeon: Jettie Booze, MD;  Location: [facility];  Service: Cardiovascular;  Laterality: N/A;  . COLONOSCOPY W/ POLYPECTOMY    . OOPHORECTOMY    . right knee menisectomy  Right   . TENNIS ELBOW RELEASE/NIRSCHEL PROCEDURE Right   . TONSILLECTOMY     age 65  . TUBAL LIGATION  1980   Social History   Occupational History  . Not on file  Tobacco Use  . Smoking status: Never Smoker  . Smokeless tobacco: Never Used  Substance and Sexual Activity  . Alcohol  use: No    Alcohol/week: 0.0 standard drinks  . Drug use: No  . Sexual activity: Not on file

## 2018-06-21 DIAGNOSIS — Z1231 Encounter for screening mammogram for malignant neoplasm of breast: Secondary | ICD-10-CM | POA: Diagnosis not present

## 2018-06-21 DIAGNOSIS — Z803 Family history of malignant neoplasm of breast: Secondary | ICD-10-CM | POA: Diagnosis not present

## 2018-06-22 DIAGNOSIS — I10 Essential (primary) hypertension: Secondary | ICD-10-CM | POA: Diagnosis not present

## 2018-06-22 DIAGNOSIS — Z981 Arthrodesis status: Secondary | ICD-10-CM | POA: Diagnosis not present

## 2018-06-22 DIAGNOSIS — S129XXA Fracture of neck, unspecified, initial encounter: Secondary | ICD-10-CM | POA: Diagnosis not present

## 2018-06-22 DIAGNOSIS — Z6825 Body mass index (BMI) 25.0-25.9, adult: Secondary | ICD-10-CM | POA: Diagnosis not present

## 2018-09-21 ENCOUNTER — Ambulatory Visit: Payer: Medicare Other | Admitting: Nurse Practitioner

## 2018-10-04 DIAGNOSIS — M8589 Other specified disorders of bone density and structure, multiple sites: Secondary | ICD-10-CM | POA: Diagnosis not present

## 2018-10-04 DIAGNOSIS — Z8262 Family history of osteoporosis: Secondary | ICD-10-CM | POA: Diagnosis not present

## 2018-10-04 DIAGNOSIS — I639 Cerebral infarction, unspecified: Secondary | ICD-10-CM | POA: Diagnosis not present

## 2018-10-20 DIAGNOSIS — J019 Acute sinusitis, unspecified: Secondary | ICD-10-CM | POA: Diagnosis not present
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
## 2018-10-25 ENCOUNTER — Ambulatory Visit (INDEPENDENT_AMBULATORY_CARE_PROVIDER_SITE_OTHER): Payer: Medicare Other | Admitting: Nurse Practitioner

## 2018-10-25 ENCOUNTER — Encounter: Payer: Self-pay | Admitting: Nurse Practitioner

## 2018-10-25 VITALS — BP 122/80 | HR 62 | Ht 64.0 in | Wt 144.4 lb

## 2018-10-25 DIAGNOSIS — I10 Essential (primary) hypertension: Secondary | ICD-10-CM

## 2018-10-25 DIAGNOSIS — Z8673 Personal history of transient ischemic attack (TIA), and cerebral infarction without residual deficits: Secondary | ICD-10-CM | POA: Diagnosis not present

## 2018-10-25 DIAGNOSIS — E785 Hyperlipidemia, unspecified: Secondary | ICD-10-CM

## 2018-10-25 NOTE — Patient Instructions (Addendum)
Continue aspirin for secondary stroke prevention Maintain strict control of hypertension with blood pressure goal below 130/90 today's reading 122/80 Diabetes with hemoglobin A1c below 6.5 LDL cholesterol below 70 continue Crestor  continue to exercise by walking  Healthy diet Will  Discharge from stroke clinic  Stroke Prevention Some medical conditions and lifestyle choices can lead to a higher risk for a stroke. You can help to prevent a stroke by making nutrition, lifestyle, and other changes. What nutrition changes can be made?   Eat healthy foods. ? Choose foods that are high in fiber. These include:  Fresh fruits.  Fresh vegetables.  Whole grains. ? Eat at least 5 or more servings of fruits and vegetables each day. Try to fill half of your plate at each meal with fruits and vegetables. ? Choose lean protein foods. These include:  Lowfat (lean) cuts of meat.  Chicken without skin.  Fish.  Tofu.  Beans.  Nuts. ? Eat low-fat dairy products. ? Avoid foods that:  Are high in salt (sodium).  Have saturated fat.  Have trans fat.  Have cholesterol.  Are processed.  Are premade.  Follow eating guidelines as told by your doctor. These may include: ? Reducing how many calories you eat and drink each day. ? Limiting how much salt you eat or drink each day to 1,500 milligrams (mg). ? Using only healthy fats for cooking. These include:  Olive oil.  Canola oil.  Sunflower oil. ? Counting how many carbohydrates you eat and drink each day. What lifestyle changes can be made?  Try to stay at a healthy weight. Talk to your doctor about what a good weight is for you.  Get at least 30 minutes of moderate physical activity at least 5 days a week. This can include: ? Fast walking. ? Biking. ? Swimming.  Do not use any products that have nicotine or tobacco. This includes cigarettes and e-cigarettes. If you need help quitting, ask your doctor. Avoid being around  tobacco smoke in general.  Limit how much alcohol you drink to no more than 1 drink a day for nonpregnant women and 2 drinks a day for men. One drink equals 12 oz of beer, 5 oz of wine, or 1 oz of hard liquor.  Do not use drugs.  Avoid taking birth control pills. Talk to your doctor about the risks of taking birth control pills if: ? You are over 77 years old. ? You smoke. ? You get migraines. ? You have had a blood clot. What other changes can be made?  Manage your cholesterol. ? It is important to eat a healthy diet. ? If your cholesterol cannot be managed through your diet, you may also need to take medicines. Take medicines as told by your doctor.  Manage your diabetes. ? It is important to eat a healthy diet and to exercise regularly. ? If your blood sugar cannot be managed through diet and exercise, you may need to take medicines. Take medicines as told by your doctor.  Control your high blood pressure (hypertension). ? Try to keep your blood pressure below 130/80. This can help lower your risk of stroke. ? It is important to eat a healthy diet and to exercise regularly. ? If your blood pressure cannot be managed through diet and exercise, you may need to take medicines. Take medicines as told by your doctor. ? Ask your doctor if you should check your blood pressure at home. ? Have your blood pressure checked every year.  Do this even if your blood pressure is normal.  Talk to your doctor about getting checked for a sleep disorder. Signs of this can include: ? Snoring a lot. ? Feeling very tired.  Take over-the-counter and prescription medicines only as told by your doctor. These may include aspirin or blood thinners (antiplatelets or anticoagulants).  Make sure that any other medical conditions you have are managed. Where to find more information  American Stroke Association: www.strokeassociation.org  National Stroke Association: www.stroke.org Get help right away  if:  You have any symptoms of stroke. "BE FAST" is an easy way to remember the main warning signs: ? B - Balance. Signs are dizziness, sudden trouble walking, or loss of balance. ? E - Eyes. Signs are trouble seeing or a sudden change in how you see. ? F - Face. Signs are sudden weakness or loss of feeling of the face, or the face or eyelid drooping on one side. ? A - Arms. Signs are weakness or loss of feeling in an arm. This happens suddenly and usually on one side of the body. ? S - Speech. Signs are sudden trouble speaking, slurred speech, or trouble understanding what people say. ? T - Time. Time to call emergency services. Write down what time symptoms started.  You have other signs of stroke, such as: ? A sudden, very bad headache with no known cause. ? Feeling sick to your stomach (nausea). ? Throwing up (vomiting). ? Jerky movements you cannot control (seizure). These symptoms may represent a serious problem that is an emergency. Do not wait to see if the symptoms will go away. Get medical help right away. Call your local emergency services (911 in the [place] Do not drive yourself to the hospital. Summary  You can prevent a stroke by eating healthy, exercising, not smoking, drinking less alcohol, and treating other health problems, such as diabetes, high blood pressure, or high cholesterol.  Do not use any products that contain nicotine or tobacco, such as cigarettes and e-cigarettes.  Get help right away if you have any signs or symptoms of a stroke. This information is not intended to replace advice given to you by your health care provider. Make sure you discuss any questions you have with your health care provider. Document Released: 02/17/2012 Document Revised: 11/19/2016 Document Reviewed: 11/19/2016 Elsevier Interactive Patient Education  2019 [employer].

## 2018-10-28 DIAGNOSIS — H53462 Homonymous bilateral field defects, left side: Secondary | ICD-10-CM | POA: Diagnosis not present

## 2019-04-05 DIAGNOSIS — I129 Hypertensive chronic kidney disease with stage 1 through stage 4 chronic kidney disease, or unspecified chronic kidney disease: Secondary | ICD-10-CM | POA: Diagnosis not present

## 2019-04-05 DIAGNOSIS — Z1389 Encounter for screening for other disorder: Secondary | ICD-10-CM | POA: Diagnosis not present

## 2019-04-05 DIAGNOSIS — M899 Disorder of bone, unspecified: Secondary | ICD-10-CM | POA: Diagnosis not present

## 2019-04-05 DIAGNOSIS — E7800 Pure hypercholesterolemia, unspecified: Secondary | ICD-10-CM | POA: Diagnosis not present

## 2019-04-05 DIAGNOSIS — Z0000 Encounter for general adult medical examination without abnormal findings: Secondary | ICD-10-CM | POA: Diagnosis not present

## 2019-04-05 DIAGNOSIS — M5030 Other cervical disc degeneration, unspecified cervical region: Secondary | ICD-10-CM | POA: Diagnosis not present

## 2019-04-05 DIAGNOSIS — E559 Vitamin D deficiency, unspecified: Secondary | ICD-10-CM | POA: Diagnosis not present

## 2019-04-05 DIAGNOSIS — Z8673 Personal history of transient ischemic attack (TIA), and cerebral infarction without residual deficits: Secondary | ICD-10-CM | POA: Diagnosis not present

## 2019-04-05 DIAGNOSIS — F411 Generalized anxiety disorder: Secondary | ICD-10-CM | POA: Diagnosis not present

## 2019-04-05 DIAGNOSIS — N183 Chronic kidney disease, stage 3 (moderate): Secondary | ICD-10-CM | POA: Diagnosis not present

## 2019-04-05 DIAGNOSIS — K219 Gastro-esophageal reflux disease without esophagitis: Secondary | ICD-10-CM | POA: Diagnosis not present

## 2019-04-12 DIAGNOSIS — E7800 Pure hypercholesterolemia, unspecified: Secondary | ICD-10-CM | POA: Diagnosis not present

## 2019-04-12 DIAGNOSIS — I129 Hypertensive chronic kidney disease with stage 1 through stage 4 chronic kidney disease, or unspecified chronic kidney disease: Secondary | ICD-10-CM | POA: Diagnosis not present

## 2019-04-12 DIAGNOSIS — E559 Vitamin D deficiency, unspecified: Secondary | ICD-10-CM | POA: Diagnosis not present

## 2019-04-28 DIAGNOSIS — H5203 Hypermetropia, bilateral: Secondary | ICD-10-CM | POA: Diagnosis not present

## 2019-04-28 DIAGNOSIS — H52203 Unspecified astigmatism, bilateral: Secondary | ICD-10-CM | POA: Diagnosis not present

## 2019-04-28 DIAGNOSIS — H524 Presbyopia: Secondary | ICD-10-CM | POA: Diagnosis not present

## 2019-04-28 DIAGNOSIS — H25813 Combined forms of age-related cataract, bilateral: Secondary | ICD-10-CM | POA: Diagnosis not present

## 2019-05-01 DIAGNOSIS — Z23 Encounter for immunization: Secondary | ICD-10-CM | POA: Diagnosis not present

## 2019-05-20 DIAGNOSIS — J309 Allergic rhinitis, unspecified: Secondary | ICD-10-CM | POA: Diagnosis not present

## 2019-07-19 DIAGNOSIS — Z1231 Encounter for screening mammogram for malignant neoplasm of breast: Secondary | ICD-10-CM | POA: Diagnosis not present

## 2019-07-19 DIAGNOSIS — Z803 Family history of malignant neoplasm of breast: Secondary | ICD-10-CM | POA: Diagnosis not present

## 2019-09-28 ENCOUNTER — Ambulatory Visit: Payer: BLUE CROSS/BLUE SHIELD

## 2019-10-02 DIAGNOSIS — Z23 Encounter for immunization: Secondary | ICD-10-CM | POA: Diagnosis not present

## 2019-10-06 DIAGNOSIS — I129 Hypertensive chronic kidney disease with stage 1 through stage 4 chronic kidney disease, or unspecified chronic kidney disease: Secondary | ICD-10-CM | POA: Diagnosis not present

## 2019-10-06 DIAGNOSIS — E7800 Pure hypercholesterolemia, unspecified: Secondary | ICD-10-CM | POA: Diagnosis not present

## 2019-10-06 DIAGNOSIS — E559 Vitamin D deficiency, unspecified: Secondary | ICD-10-CM | POA: Diagnosis not present

## 2019-10-06 DIAGNOSIS — M5030 Other cervical disc degeneration, unspecified cervical region: Secondary | ICD-10-CM | POA: Diagnosis not present

## 2019-10-06 DIAGNOSIS — N183 Chronic kidney disease, stage 3 unspecified: Secondary | ICD-10-CM | POA: Diagnosis not present

## 2019-10-06 DIAGNOSIS — Z8673 Personal history of transient ischemic attack (TIA), and cerebral infarction without residual deficits: Secondary | ICD-10-CM | POA: Diagnosis not present

## 2019-10-06 DIAGNOSIS — K219 Gastro-esophageal reflux disease without esophagitis: Secondary | ICD-10-CM | POA: Diagnosis not present

## 2019-10-06 DIAGNOSIS — F411 Generalized anxiety disorder: Secondary | ICD-10-CM | POA: Diagnosis not present

## 2019-10-06 DIAGNOSIS — M899 Disorder of bone, unspecified: Secondary | ICD-10-CM | POA: Diagnosis not present

## 2019-10-07 ENCOUNTER — Ambulatory Visit: Payer: BLUE CROSS/BLUE SHIELD

## 2019-10-09 ENCOUNTER — Ambulatory Visit: Payer: BLUE CROSS/BLUE SHIELD

## 2019-10-11 DIAGNOSIS — E559 Vitamin D deficiency, unspecified: Secondary | ICD-10-CM | POA: Diagnosis not present

## 2019-10-11 DIAGNOSIS — E7800 Pure hypercholesterolemia, unspecified: Secondary | ICD-10-CM | POA: Diagnosis not present

## 2019-10-11 DIAGNOSIS — I129 Hypertensive chronic kidney disease with stage 1 through stage 4 chronic kidney disease, or unspecified chronic kidney disease: Secondary | ICD-10-CM | POA: Diagnosis not present

## 2019-10-30 DIAGNOSIS — Z23 Encounter for immunization: Secondary | ICD-10-CM | POA: Diagnosis not present

## 2019-11-08 DIAGNOSIS — H25813 Combined forms of age-related cataract, bilateral: Secondary | ICD-10-CM | POA: Diagnosis not present

## 2019-11-18 DIAGNOSIS — Z01419 Encounter for gynecological examination (general) (routine) without abnormal findings: Secondary | ICD-10-CM | POA: Diagnosis not present

## 2019-12-13 ENCOUNTER — Encounter: Payer: Self-pay | Admitting: Physician Assistant

## 2019-12-13 ENCOUNTER — Ambulatory Visit (INDEPENDENT_AMBULATORY_CARE_PROVIDER_SITE_OTHER): Payer: Medicare Other | Admitting: Physician Assistant

## 2019-12-13 ENCOUNTER — Other Ambulatory Visit: Payer: Self-pay

## 2019-12-13 DIAGNOSIS — D485 Neoplasm of uncertain behavior of skin: Secondary | ICD-10-CM | POA: Diagnosis not present

## 2019-12-13 DIAGNOSIS — D492 Neoplasm of unspecified behavior of bone, soft tissue, and skin: Secondary | ICD-10-CM

## 2019-12-13 DIAGNOSIS — D0439 Carcinoma in situ of skin of other parts of face: Secondary | ICD-10-CM | POA: Diagnosis not present

## 2019-12-13 DIAGNOSIS — C4492 Squamous cell carcinoma of skin, unspecified: Secondary | ICD-10-CM

## 2019-12-13 DIAGNOSIS — Z1283 Encounter for screening for malignant neoplasm of skin: Secondary | ICD-10-CM

## 2019-12-13 HISTORY — DX: Squamous cell carcinoma of skin, unspecified: C44.92

## 2019-12-13 NOTE — Progress Notes (Signed)
   Follow-Up Visit   Subjective  Kendra Deleon is a 69 y.o. female who presents for the following: Annual Exam (yearly skin check check spot under left eye red and mask and glasses). Lesion has been there for one year. No bleeding but top is off with blood present. Nontender. No treatment. Persistent. No H/O NMSC.   The following portions of the chart were reviewed this encounter and updated as appropriate: Tobacco  Allergies  Meds  Problems  Med Hx  Surg Hx  Fam Hx      Objective  Well appearing patient in no apparent distress; mood and affect are within normal limits.  A full examination was performed including scalp, head, eyes, ears, nose, lips, neck, chest, axillae, abdomen, back, buttocks, bilateral upper extremities, bilateral lower extremities, hands, feet, fingers, toes, fingernails, and toenails. All findings within normal limits unless otherwise noted below.  Objective  Left Malar Cheek: Pearly papule with telangectasia.      Objective  Head to Toe: No DN   Assessment & Plan  Neoplasm of skin Left Malar Cheek  Skin / nail biopsy Type of biopsy: tangential   Informed consent: discussed and consent obtained   Timeout: patient name, date of birth, surgical site, and procedure verified   Anesthesia: the lesion was anesthetized in a standard fashion   Anesthetic:  1% lidocaine w/ epinephrine 1-100,000 local infiltration Instrument used: flexible razor blade   Hemostasis achieved with: aluminum chloride and electrodesiccation   Outcome: patient tolerated procedure well   Post-procedure details: wound care instructions given   Additional details:  Cautery  Specimen 1 - Surgical pathology Differential Diagnosis: BCC Check Margins: No  Screening exam for skin cancer Head to Toe  No DN

## 2019-12-13 NOTE — Patient Instructions (Signed)

## 2019-12-15 ENCOUNTER — Telehealth: Payer: Self-pay

## 2019-12-15 NOTE — Telephone Encounter (Signed)
-----   Message from Warren Danes, [place] sent at 12/15/2019  1:03 PM EDT ----- 30 min sx

## 2019-12-15 NOTE — Telephone Encounter (Signed)
Phone call to patient with her Pathology results.  Patient aware of Pathology results and appointment scheduled for 02/16/2020 @ 9:30am.

## 2019-12-21 ENCOUNTER — Telehealth: Payer: Self-pay | Admitting: Physician Assistant

## 2019-12-21 NOTE — Telephone Encounter (Signed)
Got results, was told no appt till June. Wants to know why she has to come back

## 2019-12-22 NOTE — Telephone Encounter (Signed)
Explained pathology to patient, patient had questions: 30 minute surgery appointment already made for June 17 with kelli sheffield.

## 2019-12-22 NOTE — Telephone Encounter (Signed)
Patient is getting upset because she is not getting an answer as to why she needs to come back for a follow up and why she needs to wait so long for an appointment.  Chart # [id number]

## 2019-12-22 NOTE — Telephone Encounter (Signed)
Tell her that it is ok to wait until June.  These are not dangerous and I got 90% with biopsy. Ask about healing. Tell to call prn

## 2020-02-16 ENCOUNTER — Encounter: Payer: Self-pay | Admitting: Physician Assistant

## 2020-02-16 ENCOUNTER — Other Ambulatory Visit: Payer: Self-pay

## 2020-02-16 ENCOUNTER — Ambulatory Visit (INDEPENDENT_AMBULATORY_CARE_PROVIDER_SITE_OTHER): Payer: Medicare Other | Admitting: Physician Assistant

## 2020-02-16 DIAGNOSIS — D098 Carcinoma in situ of other specified sites: Secondary | ICD-10-CM

## 2020-02-16 DIAGNOSIS — D0439 Carcinoma in situ of skin of other parts of face: Secondary | ICD-10-CM | POA: Diagnosis not present

## 2020-02-16 NOTE — Patient Instructions (Signed)

## 2020-02-16 NOTE — Progress Notes (Signed)
   Follow-Up Visit   Subjective  Kendra Deleon is a 69 y.o. female who presents for the following:   The following portions of the chart were reviewed this encounter and updated as appropriate:     Objective  Well appearing patient in no apparent distress; mood and affect are within normal limits.  A focused examination was performed including face, neck, chest and back and face. Relevant physical exam findings are noted in the Assessment and Plan.  Objective  Left Malar Cheek: Scaly pink papule or plaque.    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  Destruction of lesion Complexity: simple   Destruction method: electrodesiccation and curettage   Informed consent: discussed and consent obtained   Timeout:  patient name, date of birth, surgical site, and procedure verified Anesthesia: the lesion was anesthetized in a standard fashion   Anesthetic:  1% lidocaine w/ epinephrine 1-100,000 local infiltration Curettage performed in three different directions: Yes   Electrodesiccation performed over the curetted area: Yes   Curettage cycles:  3 Lesion length (cm):  1.2 Lesion width (cm):  1.2 Margin per side (cm):  0.1 Final wound size (cm):  1.4 Hemostasis achieved with:  aluminum chloride Outcome: patient tolerated procedure well with no complications   Post-procedure details: wound care instructions given

## 2020-04-10 DIAGNOSIS — F411 Generalized anxiety disorder: Secondary | ICD-10-CM | POA: Diagnosis not present

## 2020-04-10 DIAGNOSIS — E7800 Pure hypercholesterolemia, unspecified: Secondary | ICD-10-CM | POA: Diagnosis not present

## 2020-04-10 DIAGNOSIS — I129 Hypertensive chronic kidney disease with stage 1 through stage 4 chronic kidney disease, or unspecified chronic kidney disease: Secondary | ICD-10-CM | POA: Diagnosis not present

## 2020-04-10 DIAGNOSIS — E559 Vitamin D deficiency, unspecified: Secondary | ICD-10-CM | POA: Diagnosis not present

## 2020-04-10 DIAGNOSIS — N183 Chronic kidney disease, stage 3 unspecified: Secondary | ICD-10-CM | POA: Diagnosis not present

## 2020-04-10 DIAGNOSIS — M899 Disorder of bone, unspecified: Secondary | ICD-10-CM | POA: Diagnosis not present

## 2020-04-10 DIAGNOSIS — K219 Gastro-esophageal reflux disease without esophagitis: Secondary | ICD-10-CM | POA: Diagnosis not present

## 2020-04-10 DIAGNOSIS — Z0000 Encounter for general adult medical examination without abnormal findings: Secondary | ICD-10-CM | POA: Diagnosis not present

## 2020-04-10 DIAGNOSIS — Z8673 Personal history of transient ischemic attack (TIA), and cerebral infarction without residual deficits: Secondary | ICD-10-CM | POA: Diagnosis not present

## 2020-04-10 NOTE — Addendum Note (Signed)
Addended by: Robyne Askew R on: 04/10/2020 09:30 AM   Modules accepted: Level of Service

## 2020-05-01 DIAGNOSIS — H2513 Age-related nuclear cataract, bilateral: Secondary | ICD-10-CM | POA: Diagnosis not present

## 2020-05-01 DIAGNOSIS — H5203 Hypermetropia, bilateral: Secondary | ICD-10-CM | POA: Diagnosis not present

## 2020-05-18 ENCOUNTER — Encounter: Payer: Self-pay | Admitting: Physician Assistant

## 2020-05-18 ENCOUNTER — Ambulatory Visit (INDEPENDENT_AMBULATORY_CARE_PROVIDER_SITE_OTHER): Payer: Medicare Other | Admitting: Physician Assistant

## 2020-05-18 ENCOUNTER — Other Ambulatory Visit: Payer: Self-pay

## 2020-05-18 DIAGNOSIS — Z85828 Personal history of other malignant neoplasm of skin: Secondary | ICD-10-CM | POA: Diagnosis not present

## 2020-05-23 ENCOUNTER — Encounter: Payer: Self-pay | Admitting: Physician Assistant

## 2020-05-25 NOTE — Progress Notes (Signed)
   Follow-Up Visit   Subjective  Kendra Deleon is a 69 y.o. female who presents for the following: Follow-up (3 month follow up from 6/17--left cheek).   The following portions of the chart were reviewed this encounter and updated as appropriate: Tobacco  Allergies  Meds  Problems  Med Hx  Surg Hx  Fam Hx      Objective  Well appearing patient in no apparent distress; mood and affect are within normal limits.  A focused examination was performed including face. Relevant physical exam findings are noted in the Assessment and Plan.  Objective  Left Malar Cheek: No atypical nevi No signs of non-mole skin cancer. Scar clear with slight PIH.   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  The documentation on 05/25/20 for the exam, diagnosis, procedures and orders are all accurate and complete.

## 2020-05-26 DIAGNOSIS — Z23 Encounter for immunization: Secondary | ICD-10-CM | POA: Diagnosis not present

## 2020-06-19 ENCOUNTER — Ambulatory Visit (INDEPENDENT_AMBULATORY_CARE_PROVIDER_SITE_OTHER): Payer: Medicare Other

## 2020-06-19 ENCOUNTER — Other Ambulatory Visit: Payer: Self-pay

## 2020-06-19 ENCOUNTER — Encounter: Payer: Self-pay | Admitting: Orthopaedic Surgery

## 2020-06-19 ENCOUNTER — Ambulatory Visit (INDEPENDENT_AMBULATORY_CARE_PROVIDER_SITE_OTHER): Payer: Medicare Other | Admitting: Orthopaedic Surgery

## 2020-06-19 DIAGNOSIS — M79652 Pain in left thigh: Secondary | ICD-10-CM | POA: Diagnosis not present

## 2020-06-19 DIAGNOSIS — M79605 Pain in left leg: Secondary | ICD-10-CM

## 2020-06-19 DIAGNOSIS — M25562 Pain in left knee: Secondary | ICD-10-CM | POA: Diagnosis not present

## 2020-06-19 DIAGNOSIS — G8929 Other chronic pain: Secondary | ICD-10-CM | POA: Diagnosis not present

## 2020-06-19 MED ORDER — LIDOCAINE HCL 1 % IJ SOLN
2.0000 mL | INTRAMUSCULAR | Status: AC | PRN
  Administered 2020-06-19: 2 mL

## 2020-06-19 MED ORDER — BUPIVACAINE HCL 0.5 % IJ SOLN
2.0000 mL | INTRAMUSCULAR | Status: AC | PRN
  Administered 2020-06-19: 2 mL via INTRA_ARTICULAR

## 2020-06-19 NOTE — Progress Notes (Signed)
Office Visit Note   Patient: Kendra Deleon           Date of Birth: [birth year]           MRN: [id number] Visit Date: 06/19/2020              Requested by: Gaynelle Arabian, MD [address] [place],  [place] [address] PCP: Gaynelle Arabian, MD   Assessment & Plan: Visit Diagnoses:  1. Pain in left leg   2. Chronic pain of left knee   3. Left thigh pain     Plan: [name] has been experiencing some pain in her left distal thigh and knee region for the last several months.  She first noted pain after slipping down some steps.  She relates remembering that her left leg "went under me".  She had a second injury similar to that a little time later with an exacerbation of her discomfort.  She has had some chronic numbness in the very localized area along the distal lateral thigh with some pins-and-needles.  She is not having any back pain buttock pain or hip discomfort.  Pain is worse in the morning and evening.  She is tried Advil or Tylenol.  No weakness.  Pain seems to be worse when she wakes up in the morning but it does not awaken her. I do not think her pain is referred from the lumbar spine or from her left hip.  She could have had soft tissue injury when she fell several months ago which might account for the burning and the numbness and tingling in a very localized area along the lateral distal thigh.  There is also a possibility that the pain is referred from her knee with evidence of mild arthritis by film.  I suggested Voltaren gel to the area of discomfort and I am going to inject the knee with cortisone from a diagnostic and therapeutic standpoint.  I would like her to call in let me know how she is doing over the next 2 to 3 weeks  Follow-Up Instructions: No follow-ups on file.   Orders:  Orders Placed This Encounter  Procedures   XR Lumbar Spine 2-3 Views   XR KNEE 3 VIEW LEFT   No orders of the defined types were placed in this encounter.      Procedures: Large Joint Inj: L knee on 06/19/2020 4:04 PM Indications: pain and diagnostic evaluation Details: 25 G 1.5 in needle, anterolateral approach  Arthrogram: No  Medications: 2 mL lidocaine 1 %; 2 mL bupivacaine 0.5 %  2 mL betamethasone Procedure, treatment alternatives, risks and benefits explained, specific risks discussed. Consent was given by the patient. Patient was prepped and draped in the usual sterile fashion.       Clinical Data: No additional findings.   Subjective: Chief Complaint  Patient presents with   Left Leg - Pain, Numbness  Patient presents today for left leg pain. She states that she slipping going down stairs two months ago and landed with her left leg bent and underneath her. She has had pain, numbness, pins and needles since. The pain and numbness is located the most at her lateral lower thigh and will radiate down her leg. The pain is worse in the morning upon getting up. No weakness in her legs. She takes Advil or Tylenol as needed.   HPI  Review of Systems   Objective: Vital Signs: There were no vitals taken for this visit.  Physical Exam  Constitutional:      Appearance: She is well-developed.  Eyes:     Pupils: Pupils are equal, round, and reactive to light.  Pulmonary:     Effort: Pulmonary effort is normal.  Skin:    General: Skin is warm and dry.  Neurological:     Mental Status: She is alert and oriented to person, place, and time.  Psychiatric:        Behavior: Behavior normal.     Ortho Exam awake alert and oriented x3.  Comfortable sitting.  No acute distress.  Walks without a limp.  Does have some very minimal subjective decreased light touch in area about the size of my fist on the lateral distal left thigh.  No induration or skin changes.  Does have some lateral joint pain left knee.  No effusion.  No instability.  No medial joint pain.  Minimal patellar crepitation but no pain.  Full knee extension and flexed over 105  degrees without instability.  No popliteal pain or mass.  No calf pain.  Good pulses distally.  Straight leg raise negative.  No percussible tenderness of lumbar spine or either SI joint.  Painless range of motion both hips  Specialty Comments:  No specialty comments available.  Imaging: XR Lumbar Spine 2-3 Views  Result Date: 06/19/2020 Films of the lumbar spine obtained in 2 projections standing.  No evidence of a listhesis or scoliosis.  There are some degenerative changes at L1-2 and L2-3 with anterior osteophytes and some narrowing of the disc space.  Acute changes.  Some degenerative change at the facet joints at L4-5 and L5-S1    PMFS History: Patient Active Problem List   Diagnosis Date Noted   Pain in left knee 06/19/2020   Left thigh pain 06/19/2020   History of stroke 10/25/2018   Nontraumatic cortical hemorrhage of right cerebral hemisphere ([facility]) 07/28/2017   CVA (cerebral vascular accident) ([facility]) 07/06/2017   CVA (cerebrovascular accident) ([facility]) 07/05/2017   HNP (herniated nucleus pulposus), cervical 11/13/2016   Normal coronary arteries 06/26/2015   Elevated blood sugar 06/26/2015   Family history of coronary artery disease 56/38/7564   Diastolic dysfunction 33/29/5188   Atypical chest pain    Abnormal nuclear stress test    Chest pain 05/28/2015   Depression 05/28/2015   Dyslipidemia 05/28/2015   Essential hypertension 05/28/2015   Normocytic anemia 05/28/2015   GERD (gastroesophageal reflux disease) 05/28/2015   Hyperlipidemia 05/28/2015   Paresthesia 05/28/2015   Headache    Paresthesias    Past Medical History:  Diagnosis Date   Chronic kidney disease    stage 3 at last physical   Depression    GERD (gastroesophageal reflux disease)    Hyperlipidemia    Hypertension    PONV (postoperative nausea and vomiting)    No problem with newwer anesthesia   Squamous cell carcinoma of skin 12/13/2019   left malar cheek(CX35FU)    Stroke ([facility])     Family History  Problem Relation Age of Onset   Hypertension Mother    Hyperlipidemia Mother    Coronary artery disease Father        died at 52 from MI   Diabetes Mellitus I Father        father had juvenile diabetes   Breast cancer Sister    Heart attack Maternal Grandfather    Heart attack Paternal Grandfather    Stroke Maternal Grandmother    Stroke Paternal Grandmother     Past Surgical History:  Procedure  Laterality Date   achilles tendon rupture Left    spurs   ANTERIOR CERVICAL DECOMP/DISCECTOMY FUSION N/A 11/13/2016   Procedure: CERVICAL FOUR-FIVE, CERVICAL FIVE-SIX, CERVICAL SIX-SEVEN ANTERIOR CERVICAL DECOMPRESSION/DISCECTOMY FUSION;  Surgeon: Jovita Gamma, MD;  Location: [facility];  Service: Neurosurgery;  Laterality: N/A;   BREAST REDUCTION SURGERY     CARDIAC CATHETERIZATION N/A 05/29/2015   Procedure: Left Heart Cath and Coronary Angiography;  Surgeon: Jettie Booze, MD;  Location: [facility];  Service: Cardiovascular;  Laterality: N/A;   COLONOSCOPY W/ POLYPECTOMY     OOPHORECTOMY     right knee menisectomy  Right    TENNIS ELBOW RELEASE/NIRSCHEL PROCEDURE Right    TONSILLECTOMY     age 80   TUBAL LIGATION  1980   Social History   Occupational History   Not on file  Tobacco Use   Smoking status: Never Smoker   Smokeless tobacco: Never Used  Vaping Use   Vaping Use: Never used  Substance and Sexual Activity   Alcohol use: No    Alcohol/week: 0.0 standard drinks   Drug use: No   Sexual activity: Not on file

## 2020-07-05 DIAGNOSIS — Z23 Encounter for immunization: Secondary | ICD-10-CM | POA: Diagnosis not present

## 2020-07-30 ENCOUNTER — Ambulatory Visit (INDEPENDENT_AMBULATORY_CARE_PROVIDER_SITE_OTHER): Payer: Medicare Other | Admitting: Neurology

## 2020-07-30 ENCOUNTER — Encounter: Payer: Self-pay | Admitting: Neurology

## 2020-07-30 ENCOUNTER — Telehealth: Payer: Self-pay | Admitting: Neurology

## 2020-07-30 VITALS — BP 147/84 | HR 76 | Ht 63.0 in | Wt 148.2 lb

## 2020-07-30 DIAGNOSIS — G3184 Mild cognitive impairment, so stated: Secondary | ICD-10-CM

## 2020-07-30 DIAGNOSIS — E538 Deficiency of other specified B group vitamins: Secondary | ICD-10-CM | POA: Diagnosis not present

## 2020-07-30 DIAGNOSIS — R413 Other amnesia: Secondary | ICD-10-CM

## 2020-07-30 DIAGNOSIS — R799 Abnormal finding of blood chemistry, unspecified: Secondary | ICD-10-CM | POA: Diagnosis not present

## 2020-07-30 MED ORDER — CEREFOLIN 6-1-50-5 MG PO TABS: 1.0000 | ORAL_TABLET | Freq: Every morning | ORAL | 3 refills | Status: DC

## 2020-07-30 NOTE — Progress Notes (Signed)
[facility] [address] [place]. [place] [address] [phone]       OFFICE CONSULT NOTE  [name] Date of Birth:  09/25/1950 Medical Record Number:  [id number]   Referring MD: Gaynelle Arabian Reason for Referral: Memory loss and risk for Alzheimer's  HPI: [name] is a pleasant 69 year old Caucasian lady seen today for initial office consultation visit.  History is obtained from her, husband is accompanying her as well as her son Arnette Norris whom I spoke to over the phone.  I also reviewed available electronic medical records and imaging films in PACS.  Patient herself has no complaints but family expresses concern for possible Alzheimer's given her strong family history on both of maternal and paternal sides with multiple family members.  They have noticed for the first couple of years that patient at times has word finding difficulties and she struggles to find words her dose of completion.  She also has been repeating herself a lot and asked the same questions over and over again when asked about appointments of plan for the day she often forgets them and needs to ask constantly.  Family is also noticed that she gets anxious more easily and gets upset more easily at times she is short with her husband.  Executive functions seem fine she is able to manage all activities of daily living by herself and does not need any supervision or help.  She is still able to drive and has never gotten lost or had any near accidents.  She has not had any seizures, loss of consciousness.  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  It is unclear if this was a hypertensive versus hemorrhagic infarct but work-up at that time was negative.  She had residual left homonymous hemianopsia which is improved somewhat but not completely.  She was seen by me in 2018 and 2019 and then lost to follow-up.  She has had no  further clinical episodes suggestive of strokes or TIAs.  She remains on aspirin for stroke prevention and states blood pressures well controlled and today it is 147/84 in office.  ROS:   14 system review of systems is positive for memory difficulties, word finding difficulties, peripheral vision loss, imbalance, anxiety, stress, tension headache and all other systems negative  PMH:  Past Medical History:  Diagnosis Date  . Chronic kidney disease    stage 3 at last physical  . Depression   . GERD (gastroesophageal reflux disease)   . Hyperlipidemia   . Hypertension   . PONV (postoperative nausea and vomiting)    No problem with newwer anesthesia  . Squamous cell carcinoma of skin 12/13/2019   left malar cheek(CX35FU)  . Stroke [facility])     Social History:  Social History   Socioeconomic History  . Marital status: Married    Spouse name: frank  . Number of children: Not on file  . Years of education: Not on file  . Highest education level: Not on file  Occupational History  . Occupation: retired  Tobacco Use  . Smoking status: Never Smoker  . Smokeless tobacco: Never Used  Vaping Use  . Vaping Use: Never used  Substance and Sexual Activity  . Alcohol use: No    Alcohol/week: 0.0 standard drinks  . Drug use: No  . Sexual activity: Not on file  Other Topics Concern  . Not on file  Social History Narrative  Lives with Husband, Pilar Plate   Right handed   Drinks 1 cup soda occassionally   Social Determinants of Health   Financial Resource Strain:   . Difficulty of Paying Living Expenses: Not on file  Food Insecurity:   . Worried About Charity fundraiser in the Last Year: Not on file  . Ran Out of Food in the Last Year: Not on file  Transportation Needs:   . Lack of Transportation (Medical): Not on file  . Lack of Transportation (Non-Medical): Not on file  Physical Activity:   . Days of Exercise per Week: Not on file  . Minutes of Exercise per Session: Not on file   Stress:   . Feeling of Stress : Not on file  Social Connections:   . Frequency of Communication with Friends and Family: Not on file  . Frequency of Social Gatherings with Friends and Family: Not on file  . Attends Religious Services: Not on file  . Active Member of Clubs or Organizations: Not on file  . Attends Archivist Meetings: Not on file  . Marital Status: Not on file  Intimate Partner Violence:   . Fear of Current or Ex-Partner: Not on file  . Emotionally Abused: Not on file  . Physically Abused: Not on file  . Sexually Abused: Not on file    Medications:   Current Outpatient Medications on File Prior to Visit  Medication Sig Dispense Refill  . amLODipine (NORVASC) 10 MG tablet     . aspirin 81 MG EC tablet aspirin 81 mg tablet,delayed release  Take 1 tablet every day by oral route.    . Calcium Carbonate-Vitamin D3 (CALCIUM 600-D) 600-400 MG-UNIT TABS Take 1 tablet by mouth daily.    . Cholecalciferol (VITAMIN D3) 2000 UNITS TABS Take 2,000 Units by mouth daily.     . fenofibrate 160 MG tablet Take 160 mg by mouth daily.     Marland Kitchen FLUoxetine (PROZAC) 40 MG capsule 40 mg daily.    . pantoprazole (PROTONIX) 40 MG tablet Take 40 mg by mouth daily.     . rosuvastatin (CRESTOR) 40 MG tablet Take 40 mg by mouth daily.    Marland Kitchen amoxicillin (AMOXIL) 500 MG capsule Take 1,000 mg by mouth 2 (two) times daily.     Marland Kitchen guaiFENesin (MUCINEX) 600 MG 12 hr tablet Take by mouth 2 (two) times daily.    Marland Kitchen ibuprofen (ADVIL,MOTRIN) 200 MG tablet Take 400 mg every 6 (six) hours as needed by mouth for headache or moderate pain.    . Influenza Virus Vaccine Split SUSP Afluria 2014-2015 45 mcg (15 mcg x 3)/0.5 mL intramuscular suspension     No current facility-administered medications on file prior to visit.    Allergies:   Allergies  Allergen Reactions  . No Known Allergies     Physical Exam General: well developed, well nourished, seated, in no evident distress Head: head  normocephalic and atraumatic.   Neck: supple with no carotid or supraclavicular bruits Cardiovascular: regular rate and rhythm, no murmurs Musculoskeletal: no deformity Skin:  no rash/petichiae Vascular:  Normal pulses all extremities  Neurologic Exam Mental Status: Awake and fully alert. Oriented to place and time. Recent and remote memory diminished. Attention span, concentration and fund of knowledge appropriate. Mood and affect appropriate.  Mini-Mental status exam score 27/30 with deficits in recall and orientation.  Clock drawing scored 4/4.  Able to name 9 animals which can walk on 4 legs. Cranial Nerves: Fundoscopic exam reveals sharp  disc margins. Pupils equal, briskly reactive to light. Extraocular movements full without nystagmus. Visual fields show partial left homonymous hemianopsia to confrontation. Hearing intact. Facial sensation intact. Face, tongue, palate moves normally and symmetrically.  Motor: Normal bulk and tone. Normal strength in all tested extremity muscles. Sensory.: intact to touch , pinprick , position and vibratory sensation.  Coordination: Rapid alternating movements normal in all extremities. Finger-to-nose and heel-to-shin performed accurately bilaterally. Gait and Station: Arises from chair without difficulty. Stance is normal. Gait demonstrates normal stride length and balance . Able to heel, toe and tandem walk with moderate difficulty.  Reflexes: 1+ and symmetric. Toes downgoing.   NIHSS  2 Modified Rankin2   ASSESSMENT: 69 year old Caucasian lady with mild word finding and memory difficulties due to mild cognitive impairment.  She has a very strong family history of Alzheimer's and may be at risk for it.  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  Vascular risk factors of hypertension, hyperlipidemia, prior stroke and age.  She also has mild anxiety  and tension headaches.     PLAN: I had a long discussion the patient, husband as well as spoke to her son Doren Custard over the phone as well about her symptoms of memory and cognitive difficulties likely related to mild cognitive impairment.  She has a very strong family history of Alzheimer's with multiple family members and she certainly is at high risk for developing that in the future.  I recommend lab work for evaluation for reversible causes as well as EEG and MRI scan.  I recommend she start taking Cerefolin NAC 1 tablet daily as well as increase participation in cognitively challenging activities like solving crossword puzzles, playing bridge and sodoku.  We also discussed memory compensation strategies for her to use.  We will do follow-up cognitive testing and if there is documented decline may consider trial of Aricept or Namenda in the future.  I also encouraged her to increase participation in stress relaxation activities to help with her anxiety.  Continue aspirin for stroke prevention and maintain strict control of hypertension with blood pressure goal below 130/90.  She will return for follow-up in the future in 3 months or call earlier if necessary. Greater than 50% time during this 45-minute consultation visit was spent on counseling and coordination of care about her memory loss and cognitive impairment and risk for Alzheimer's discussion. Antony Contras, MD Note: This document was prepared with digital dictation and possible smart phrase technology. Any transcriptional errors that result from this process are unintentional.

## 2020-07-30 NOTE — Patient Instructions (Signed)
I had a long discussion the patient, husband as well as spoke to her son Doren Custard over the phone as well about her symptoms of memory and cognitive difficulties likely related to mild cognitive impairment.  She has a very strong family history of Alzheimer's with multiple family members and she certainly is at high risk for developing that in the future.  I recommend lab work for evaluation for reversible causes as well as EEG and MRI scan.  I recommend she start taking Cerefolin NAC 1 tablet daily as well as increase participation in cognitively challenging activities like solving crossword puzzles, playing bridge and sodoku.  We also discussed memory compensation strategies for her to use.  We will do follow-up cognitive testing and if there is documented decline may consider trial of Aricept or Namenda in the future.  I also encouraged her to increase participation in stress relaxation activities to help with her anxiety.  Continue aspirin for stroke prevention and maintain strict control of hypertension with blood pressure goal below 130/90.  She will return for follow-up in the future in 3 months or call earlier if necessary. Memory Compensation Strategies  1. Use "WARM" strategy.  W= write it down  A= associate it  R= repeat it  M= make a mental note  2.   You can keep a Social worker.  Use a 3-ring notebook with sections for the following: calendar, important names and phone numbers,  medications, doctors' names/phone numbers, lists/reminders, and a section to journal what you did  each day.   3.    Use a calendar to write appointments down.  4.    Write yourself a schedule for the day.  This can be placed on the calendar or in a separate section of the Memory Notebook.  Keeping a  regular schedule can help memory.  5.    Use medication organizer with sections for each day or morning/evening pills.  You may need help loading it  6.    Keep a basket, or pegboard by the door.  Place items  that you need to take out with you in the basket or on the pegboard.  You may also want to  include a message board for reminders.  7.    Use sticky notes.  Place sticky notes with reminders in a place where the task is performed.  For example: " turn off the  stove" placed by the stove, "lock the door" placed on the door at eye level, " take your medications" on  the bathroom mirror or by the place where you normally take your medications.  8.    Use alarms/timers.  Use while cooking to remind yourself to check on food or as a reminder to take your medicine, or as a  reminder to make a call, or as a reminder to perform another task, etc.

## 2020-07-30 NOTE — Telephone Encounter (Signed)
Medicare/bsbs supp order sent to [facility]. They will obtain the auth and reach out to the patient to schedule.

## 2020-07-31 LAB — SPECIMEN STATUS

## 2020-07-31 LAB — DEMENTIA PANEL: Homocysteine: 17.8 umol/L — ABNORMAL HIGH (ref 0.0–17.2)

## 2020-08-01 LAB — DEMENTIA PANEL
RPR Ser Ql: NONREACTIVE
TSH: 2.21 u[IU]/mL (ref 0.450–4.500)
Vitamin B-12: 156 pg/mL — ABNORMAL LOW (ref 232–1245)

## 2020-08-05 NOTE — Progress Notes (Signed)
Kindly inform the patient that lab work for reversible causes of memory loss shows low vitamin B12 levels and she needs to see her primary care physician to start vitamin B12 replacement.

## 2020-08-06 ENCOUNTER — Encounter: Payer: Self-pay | Admitting: *Deleted

## 2020-08-10 ENCOUNTER — Ambulatory Visit
Admission: RE | Admit: 2020-08-10 | Discharge: 2020-08-10 | Disposition: A | Discharge status: Home / Self Care | Payer: Medicare Other | Source: Ambulatory Visit | Attending: Neurology | Admitting: Neurology

## 2020-08-10 DIAGNOSIS — G3184 Mild cognitive impairment, so stated: Secondary | ICD-10-CM | POA: Diagnosis not present

## 2020-08-10 MED ORDER — GADOBENATE DIMEGLUMINE 529 MG/ML IV SOLN
17.0000 mL | Freq: Once | INTRAVENOUS | Status: AC | PRN
  Administered 2020-08-10: 17 mL via INTRAVENOUS

## 2020-08-13 NOTE — Progress Notes (Signed)
Kindly inform the patient that MRI scan of the brain shows evidence of old brain hemorrhage on the right on the back as well as changes of shrinkage of the brain and mild hardening of the arteries which are age-appropriate.  No new or worrisome finding.

## 2020-08-15 NOTE — Telephone Encounter (Signed)
Called patient and discussed Dr. Clydene Fake findings of MRI.  Patient was very pleased, denied any questions and expressed appreciation for the call.

## 2020-08-15 NOTE — Telephone Encounter (Signed)
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  No new or worrisome finding.

## 2020-08-28 DIAGNOSIS — Z1231 Encounter for screening mammogram for malignant neoplasm of breast: Secondary | ICD-10-CM | POA: Diagnosis not present

## 2020-08-28 DIAGNOSIS — Z803 Family history of malignant neoplasm of breast: Secondary | ICD-10-CM | POA: Diagnosis not present

## 2020-10-11 DIAGNOSIS — M899 Disorder of bone, unspecified: Secondary | ICD-10-CM | POA: Diagnosis not present

## 2020-10-11 DIAGNOSIS — E7800 Pure hypercholesterolemia, unspecified: Secondary | ICD-10-CM | POA: Diagnosis not present

## 2020-10-11 DIAGNOSIS — K219 Gastro-esophageal reflux disease without esophagitis: Secondary | ICD-10-CM | POA: Diagnosis not present

## 2020-10-11 DIAGNOSIS — I129 Hypertensive chronic kidney disease with stage 1 through stage 4 chronic kidney disease, or unspecified chronic kidney disease: Secondary | ICD-10-CM | POA: Diagnosis not present

## 2020-10-11 DIAGNOSIS — N183 Chronic kidney disease, stage 3 unspecified: Secondary | ICD-10-CM | POA: Diagnosis not present

## 2020-10-11 DIAGNOSIS — Z8673 Personal history of transient ischemic attack (TIA), and cerebral infarction without residual deficits: Secondary | ICD-10-CM | POA: Diagnosis not present

## 2020-10-11 DIAGNOSIS — E559 Vitamin D deficiency, unspecified: Secondary | ICD-10-CM | POA: Diagnosis not present

## 2020-10-11 DIAGNOSIS — F411 Generalized anxiety disorder: Secondary | ICD-10-CM | POA: Diagnosis not present

## 2020-10-11 DIAGNOSIS — Z23 Encounter for immunization: Secondary | ICD-10-CM | POA: Diagnosis not present

## 2020-10-30 DIAGNOSIS — H52203 Unspecified astigmatism, bilateral: Secondary | ICD-10-CM | POA: Diagnosis not present

## 2020-10-30 DIAGNOSIS — H5203 Hypermetropia, bilateral: Secondary | ICD-10-CM | POA: Diagnosis not present

## 2020-10-30 DIAGNOSIS — H524 Presbyopia: Secondary | ICD-10-CM | POA: Diagnosis not present

## 2020-10-30 DIAGNOSIS — H2513 Age-related nuclear cataract, bilateral: Secondary | ICD-10-CM | POA: Diagnosis not present

## 2020-11-08 ENCOUNTER — Encounter: Payer: Self-pay | Admitting: Neurology

## 2020-11-08 ENCOUNTER — Ambulatory Visit (INDEPENDENT_AMBULATORY_CARE_PROVIDER_SITE_OTHER): Payer: Medicare Other | Admitting: Neurology

## 2020-11-08 VITALS — BP 146/75 | HR 81 | Ht 63.0 in | Wt 150.0 lb

## 2020-11-08 DIAGNOSIS — G3184 Mild cognitive impairment, so stated: Secondary | ICD-10-CM

## 2020-11-08 DIAGNOSIS — E538 Deficiency of other specified B group vitamins: Secondary | ICD-10-CM | POA: Diagnosis not present

## 2020-11-08 NOTE — Progress Notes (Signed)
[facility] [address] [place]. [place] [address] [phone]       OFFICE FOLLOW UP VISITNOTE  [name] Date of Birth:  08-29-1951 Medical Record Number:  [id number]   Referring MD: Gaynelle Arabian Reason for Referral: Memory loss and risk for Alzheimer's  HPI: Initial visit 07/30/2020 :Kendra Deleon is a pleasant 70 year old Caucasian lady seen today for initial office consultation visit.  History is obtained from her, husband is accompanying her as well as her son Kendra Deleon whom I spoke to over the phone.  I also reviewed available electronic medical records and imaging films in PACS.  Patient herself has no complaints but family expresses concern for possible Alzheimer's given her strong family history on both of maternal and paternal sides with multiple family members.  They have noticed for the first couple of years that patient at times has word finding difficulties and she struggles to find words her dose of completion.  She also has been repeating herself a lot and asked the same questions over and over again when asked about appointments of plan for the day she often forgets them and needs to ask constantly.  Family is also noticed that she gets anxious more easily and gets upset more easily at times she is short with her husband.  Executive functions seem fine she is able to manage all activities of daily living by herself and does not need any supervision or help.  She is still able to drive and has never gotten lost or had any near accidents.  She has not had any seizures, loss of consciousness.  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  It is unclear if this was a hypertensive versus hemorrhagic infarct but work-up at that time was negative.  She had residual left homonymous hemianopsia which is improved somewhat but not completely.  She was seen by me in 2018 and 2019 and then lost  to follow-up.  She has had no further clinical episodes suggestive of strokes or TIAs.  She remains on aspirin for stroke prevention and states blood pressures well controlled and today it is 147/84 in office. Update 11/08/2020 : She returns for follow-up after last visit 3 months ago.  She has started taking Cerefolin then she and her husband feel it is helping.  She still continues to have mild short-term memory and intermittent word finding difficulties which are unchanged.  She does participate in solving crossword puzzles every day but does not do other cognitively challenging activities.  She was depressed for the last few months since her mother passed away in 04-Oct-2022 from advanced Alzheimer's.  She states she is learned to move 1 but she did have to have her sister come and stay with her for a while.  She did undergo MRI scan of the brain on 08/10/2020 which shows changes of hemosiderin and encephalomalacia from old right occipital hemorrhage and mild changes of small vessel disease and generalized atrophy.  No acute abnormalities.  Lab work on 07/30/2020 showed low vitamin B12 of 156 and mild homocystine elevation at 17.8.  RPR and TSH were normal.  Patient was advised to primary physician to begin B12 replacement injections but she has not done that.  EEG was also ordered but for unclear reason has not been done.  She has no new complaints ROS:   14 system review of systems is positive for memory difficulties, word finding difficulties, peripheral  vision loss, imbalance, anxiety, stress, tension headache and all other systems negative  PMH:  Past Medical History:  Diagnosis Date   Chronic kidney disease    stage 3 at last physical   Depression    GERD (gastroesophageal reflux disease)    Hyperlipidemia    Hypertension    PONV (postoperative nausea and vomiting)    No problem with newwer anesthesia   Squamous cell carcinoma of skin 12/13/2019   left malar cheek(CX35FU)   Stroke  Kendra Deleon)     Social History:  Social History   Socioeconomic History   Marital status: Married    Spouse name: Kendra Deleon   Number of children: Not on file   Years of education: Not on file   Highest education level: Not on file  Occupational History   Occupation: retired  Tobacco Use   Smoking status: Never Smoker   Smokeless tobacco: Never Used  Scientific laboratory technician Use: Never used  Substance and Sexual Activity   Alcohol use: No    Alcohol/week: 0.0 standard drinks   Drug use: No   Sexual activity: Not on file  Other Topics Concern   Not on file  Social History Narrative   Lives with Husband, Kendra Deleon   Right handed   Drinks 1 cup soda occassionally   Social Determinants of Health   Financial Resource Strain: Not on file  Food Insecurity: Not on file  Transportation Needs: Not on file  Physical Activity: Not on file  Stress: Not on file  Social Connections: Not on file  Intimate Partner Violence: Not on file    Medications:   Current Outpatient Medications on File Prior to Visit  Medication Sig Dispense Refill   amLODipine (NORVASC) 10 MG tablet      amoxicillin (AMOXIL) 500 MG capsule Take 1,000 mg by mouth 2 (two) times daily.      aspirin 81 MG EC tablet aspirin 81 mg tablet,delayed release  Take 1 tablet every day by oral route.     Calcium Carbonate-Vitamin D3 600-400 MG-UNIT TABS Take 1 tablet by mouth daily.     Cholecalciferol (VITAMIN D3) 2000 UNITS TABS Take 2,000 Units by mouth daily.      fenofibrate 160 MG tablet Take 160 mg by mouth daily.      FLUoxetine (PROZAC) 40 MG capsule 40 mg daily.     guaiFENesin (MUCINEX) 600 MG 12 hr tablet Take by mouth 2 (two) times daily.     ibuprofen (ADVIL,MOTRIN) 200 MG tablet Take 400 mg every 6 (six) hours as needed by mouth for headache or moderate pain.     Influenza Virus Vaccine Split SUSP Afluria 2014-2015 45 mcg (15 mcg x 3)/0.5 mL intramuscular suspension     L-Methylfolate-B12-B6-B2  (CEREFOLIN) 01-30-49-5 MG TABS Take 1 tablet by mouth every morning. 90 tablet 3   pantoprazole (PROTONIX) 40 MG tablet Take 40 mg by mouth daily.      rosuvastatin (CRESTOR) 40 MG tablet Take 40 mg by mouth daily.     No current facility-administered medications on file prior to visit.    Allergies:   Allergies  Allergen Reactions   No Known Allergies     Physical Exam General: well developed, well nourished, seated, in no evident distress Head: head normocephalic and atraumatic.   Neck: supple with no carotid or supraclavicular bruits Cardiovascular: regular rate and rhythm, no murmurs Musculoskeletal: no deformity Skin:  no rash/petichiae Vascular:  Normal pulses all extremities  Neurologic Exam Mental Status: Awake  and fully alert. Oriented to place and time. Recent and remote memory diminished. Attention span, concentration and fund of knowledge appropriate. Mood and affect appropriate.  Mini-Mental status not done today.  ( on 07/30/20 Exam score 27/30 with deficits in recall and orientation.)  Clock drawing scored 4/4.  Able to name 15 animals which can walk on 4 legs.  Clock drawing 4/4.  Able to copy intersecting pentagons quite well. Cranial Nerves: Fundoscopic exam not done. Pupils equal, briskly reactive to light. Extraocular movements full without nystagmus. Visual fields show partial left homonymous hemianopsia to confrontation. Hearing intact. Facial sensation intact. Face, tongue, palate moves normally and symmetrically.  Motor: Normal bulk and tone. Normal strength in all tested extremity muscles. Sensory.: intact to touch , pinprick , position and vibratory sensation.  Coordination: Rapid alternating movements normal in all extremities. Finger-to-nose and heel-to-shin performed accurately bilaterally. Gait and Station: Arises from chair without difficulty. Stance is normal. Gait demonstrates normal stride length and balance . Able to heel, toe and tandem walk with  moderate difficulty.  Reflexes: 1+ and symmetric. Toes downgoing.       ASSESSMENT: 70 year old Caucasian lady with mild word finding and memory difficulties due to mild cognitive impairment.  She has a very strong family history of Alzheimer's and may be at risk for it.  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  Vascular risk factors of hypertension, hyperlipidemia, prior stroke and age.       PLAN: I had a long discussion the patient with regards to her memory difficulties and mild cognitive impairment and risk for developing Alzheimer's and answered questions.  I recommend she continue taking Cerefolin NAC daily for her memory loss as well as low B12.  Recommend repeating B12 and homocystine levels and if inadequate she may need she may need B12 injections and will need to contact primary care physician for the same.  I encouraged her to increase participation in cognitively challenging activities like solving crossword puzzles, playing bridge and sodoku.  We also discussed memory compensation strategies.  Check EEG which was not done for unclear reasons at last visit.  Return for follow-up in 6 months or call earlier if necessary. Greater than 50% time during this 25-minute consultation visit was spent on counseling and coordination of care about her memory loss and cognitive impairment and risk for Alzheimer's discussion. Antony Contras, MD Note: This document was prepared with digital dictation and possible smart phrase technology. Any transcriptional errors that result from this process are unintentional.

## 2020-11-08 NOTE — Patient Instructions (Signed)
I had a long discussion the patient with regards to her memory difficulties and mild cognitive impairment and risk for developing Alzheimer's and answered questions.  I recommend she continue taking Cerefolin NAC daily for her memory loss as well as low B12.  Recommend repeating B12 and homocystine levels and if inadequate she may need she may need B12 injections and will need to contact primary care physician for the same.  I encouraged her to increase participation in cognitively challenging activities like solving crossword puzzles, playing bridge and sodoku.  We also discussed memory compensation strategies.  Check EEG which was not done for unclear reasons at last visit.  Return for follow-up in 6 months or call earlier if necessary. Memory Compensation Strategies  1. Use "WARM" strategy.  W= write it down  A= associate it  R= repeat it  M= make a mental note  2.   You can keep a Social worker.  Use a 3-ring notebook with sections for the following: calendar, important names and phone numbers,  medications, doctors' names/phone numbers, lists/reminders, and a section to journal what you did  each day.   3.    Use a calendar to write appointments down.  4.    Write yourself a schedule for the day.  This can be placed on the calendar or in a separate section of the Memory Notebook.  Keeping a  regular schedule can help memory.  5.    Use medication organizer with sections for each day or morning/evening pills.  You may need help loading it  6.    Keep a basket, or pegboard by the door.  Place items that you need to take out with you in the basket or on the pegboard.  You may also want to  include a message board for reminders.  7.    Use sticky notes.  Place sticky notes with reminders in a place where the task is performed.  For example: " turn off the  stove" placed by the stove, "lock the door" placed on the door at eye level, " take your medications" on  the bathroom mirror or by the  place where you normally take your medications.  8.    Use alarms/timers.  Use while cooking to remind yourself to check on food or as a reminder to take your medicine, or as a  reminder to make a call, or as a reminder to perform another task, etc.

## 2020-11-09 LAB — VITAMIN B12: Vitamin B-12: 960 pg/mL (ref 232–1245)

## 2020-11-09 LAB — HOMOCYSTEINE: Homocysteine: 11.8 umol/L (ref 0.0–17.2)

## 2020-11-18 NOTE — Progress Notes (Signed)
Kindly inform the patient that vitamin B12 as well as homocystine levels were now both satisfactory

## 2020-11-20 ENCOUNTER — Encounter: Payer: Self-pay | Admitting: *Deleted

## 2020-11-20 DIAGNOSIS — K219 Gastro-esophageal reflux disease without esophagitis: Secondary | ICD-10-CM | POA: Diagnosis not present

## 2020-11-20 DIAGNOSIS — I129 Hypertensive chronic kidney disease with stage 1 through stage 4 chronic kidney disease, or unspecified chronic kidney disease: Secondary | ICD-10-CM | POA: Diagnosis not present

## 2020-11-20 DIAGNOSIS — N183 Chronic kidney disease, stage 3 unspecified: Secondary | ICD-10-CM | POA: Diagnosis not present

## 2020-11-20 DIAGNOSIS — E7800 Pure hypercholesterolemia, unspecified: Secondary | ICD-10-CM | POA: Diagnosis not present

## 2020-11-20 DIAGNOSIS — I639 Cerebral infarction, unspecified: Secondary | ICD-10-CM | POA: Diagnosis not present

## 2020-11-20 DIAGNOSIS — I619 Nontraumatic intracerebral hemorrhage, unspecified: Secondary | ICD-10-CM | POA: Diagnosis not present

## 2020-12-06 ENCOUNTER — Other Ambulatory Visit: Payer: Self-pay

## 2020-12-06 ENCOUNTER — Ambulatory Visit (INDEPENDENT_AMBULATORY_CARE_PROVIDER_SITE_OTHER): Payer: Medicare Other | Admitting: Neurology

## 2020-12-06 DIAGNOSIS — G3184 Mild cognitive impairment, so stated: Secondary | ICD-10-CM

## 2020-12-06 DIAGNOSIS — R410 Disorientation, unspecified: Secondary | ICD-10-CM

## 2020-12-10 NOTE — Progress Notes (Signed)
Kindly inform the patient that EEG study was normal

## 2020-12-11 ENCOUNTER — Encounter: Payer: Self-pay | Admitting: *Deleted

## 2020-12-13 DIAGNOSIS — Z23 Encounter for immunization: Secondary | ICD-10-CM | POA: Diagnosis not present

## 2020-12-18 DIAGNOSIS — I639 Cerebral infarction, unspecified: Secondary | ICD-10-CM | POA: Diagnosis not present

## 2020-12-18 DIAGNOSIS — E7800 Pure hypercholesterolemia, unspecified: Secondary | ICD-10-CM | POA: Diagnosis not present

## 2020-12-18 DIAGNOSIS — I129 Hypertensive chronic kidney disease with stage 1 through stage 4 chronic kidney disease, or unspecified chronic kidney disease: Secondary | ICD-10-CM | POA: Diagnosis not present

## 2020-12-18 DIAGNOSIS — I619 Nontraumatic intracerebral hemorrhage, unspecified: Secondary | ICD-10-CM | POA: Diagnosis not present

## 2020-12-18 DIAGNOSIS — K219 Gastro-esophageal reflux disease without esophagitis: Secondary | ICD-10-CM | POA: Diagnosis not present

## 2020-12-18 DIAGNOSIS — N183 Chronic kidney disease, stage 3 unspecified: Secondary | ICD-10-CM | POA: Diagnosis not present

## 2021-02-05 DIAGNOSIS — N183 Chronic kidney disease, stage 3 unspecified: Secondary | ICD-10-CM | POA: Diagnosis not present

## 2021-02-05 DIAGNOSIS — I619 Nontraumatic intracerebral hemorrhage, unspecified: Secondary | ICD-10-CM | POA: Diagnosis not present

## 2021-02-05 DIAGNOSIS — K219 Gastro-esophageal reflux disease without esophagitis: Secondary | ICD-10-CM | POA: Diagnosis not present

## 2021-02-05 DIAGNOSIS — I129 Hypertensive chronic kidney disease with stage 1 through stage 4 chronic kidney disease, or unspecified chronic kidney disease: Secondary | ICD-10-CM | POA: Diagnosis not present

## 2021-02-05 DIAGNOSIS — I639 Cerebral infarction, unspecified: Secondary | ICD-10-CM | POA: Diagnosis not present

## 2021-02-05 DIAGNOSIS — E7800 Pure hypercholesterolemia, unspecified: Secondary | ICD-10-CM | POA: Diagnosis not present

## 2021-03-11 DIAGNOSIS — F4312 Post-traumatic stress disorder, chronic: Secondary | ICD-10-CM | POA: Diagnosis not present

## 2021-03-18 DIAGNOSIS — F4312 Post-traumatic stress disorder, chronic: Secondary | ICD-10-CM | POA: Diagnosis not present

## 2021-03-25 DIAGNOSIS — F4312 Post-traumatic stress disorder, chronic: Secondary | ICD-10-CM | POA: Diagnosis not present

## 2021-04-01 DIAGNOSIS — F4312 Post-traumatic stress disorder, chronic: Secondary | ICD-10-CM | POA: Diagnosis not present

## 2021-04-05 DIAGNOSIS — F4312 Post-traumatic stress disorder, chronic: Secondary | ICD-10-CM | POA: Diagnosis not present

## 2021-04-14 DIAGNOSIS — I639 Cerebral infarction, unspecified: Secondary | ICD-10-CM | POA: Diagnosis not present

## 2021-04-14 DIAGNOSIS — I619 Nontraumatic intracerebral hemorrhage, unspecified: Secondary | ICD-10-CM | POA: Diagnosis not present

## 2021-04-14 DIAGNOSIS — K219 Gastro-esophageal reflux disease without esophagitis: Secondary | ICD-10-CM | POA: Diagnosis not present

## 2021-04-14 DIAGNOSIS — E7800 Pure hypercholesterolemia, unspecified: Secondary | ICD-10-CM | POA: Diagnosis not present

## 2021-04-14 DIAGNOSIS — N183 Chronic kidney disease, stage 3 unspecified: Secondary | ICD-10-CM | POA: Diagnosis not present

## 2021-04-14 DIAGNOSIS — I129 Hypertensive chronic kidney disease with stage 1 through stage 4 chronic kidney disease, or unspecified chronic kidney disease: Secondary | ICD-10-CM | POA: Diagnosis not present

## 2021-04-15 DIAGNOSIS — Z1389 Encounter for screening for other disorder: Secondary | ICD-10-CM | POA: Diagnosis not present

## 2021-04-15 DIAGNOSIS — I129 Hypertensive chronic kidney disease with stage 1 through stage 4 chronic kidney disease, or unspecified chronic kidney disease: Secondary | ICD-10-CM | POA: Diagnosis not present

## 2021-04-15 DIAGNOSIS — Z8673 Personal history of transient ischemic attack (TIA), and cerebral infarction without residual deficits: Secondary | ICD-10-CM | POA: Diagnosis not present

## 2021-04-15 DIAGNOSIS — Z0000 Encounter for general adult medical examination without abnormal findings: Secondary | ICD-10-CM | POA: Diagnosis not present

## 2021-04-15 DIAGNOSIS — F411 Generalized anxiety disorder: Secondary | ICD-10-CM | POA: Diagnosis not present

## 2021-04-15 DIAGNOSIS — E559 Vitamin D deficiency, unspecified: Secondary | ICD-10-CM | POA: Diagnosis not present

## 2021-04-15 DIAGNOSIS — E7800 Pure hypercholesterolemia, unspecified: Secondary | ICD-10-CM | POA: Diagnosis not present

## 2021-04-15 DIAGNOSIS — K219 Gastro-esophageal reflux disease without esophagitis: Secondary | ICD-10-CM | POA: Diagnosis not present

## 2021-04-15 DIAGNOSIS — M899 Disorder of bone, unspecified: Secondary | ICD-10-CM | POA: Diagnosis not present

## 2021-04-15 DIAGNOSIS — N183 Chronic kidney disease, stage 3 unspecified: Secondary | ICD-10-CM | POA: Diagnosis not present

## 2021-04-15 DIAGNOSIS — M461 Sacroiliitis, not elsewhere classified: Secondary | ICD-10-CM | POA: Diagnosis not present

## 2021-04-22 DIAGNOSIS — F4312 Post-traumatic stress disorder, chronic: Secondary | ICD-10-CM | POA: Diagnosis not present

## 2021-05-06 DIAGNOSIS — F4312 Post-traumatic stress disorder, chronic: Secondary | ICD-10-CM | POA: Diagnosis not present

## 2021-05-20 DIAGNOSIS — F4312 Post-traumatic stress disorder, chronic: Secondary | ICD-10-CM | POA: Diagnosis not present

## 2021-05-21 ENCOUNTER — Ambulatory Visit: Payer: Medicare Other | Admitting: Neurology

## 2021-05-22 ENCOUNTER — Ambulatory Visit (INDEPENDENT_AMBULATORY_CARE_PROVIDER_SITE_OTHER): Payer: Medicare Other | Admitting: Neurology

## 2021-05-22 VITALS — BP 148/81 | HR 72 | Ht 63.0 in | Wt 143.0 lb

## 2021-05-22 DIAGNOSIS — G3184 Mild cognitive impairment, so stated: Secondary | ICD-10-CM | POA: Diagnosis not present

## 2021-05-22 DIAGNOSIS — R413 Other amnesia: Secondary | ICD-10-CM

## 2021-05-22 NOTE — Progress Notes (Signed)
[facility] [address] [place]. [place] [address] [phone]       OFFICE FOLLOW UP VISITNOTE  [name] Date of Birth:  [birth year] Medical Record Number:  [id number]   Referring MD: Kendra Deleon Reason for Referral: Memory loss and risk for Alzheimer's  HPI: Initial visit 07/30/2020 :Kendra Deleon is a pleasant 69 year old Caucasian lady seen today for initial office consultation visit.  History is obtained from her, husband is accompanying her as well as her son Kendra Deleon whom I spoke to over the phone.  I also reviewed available electronic medical records and imaging films in PACS.  Patient herself has no complaints but family expresses concern for possible Alzheimer's given her strong family history on both of maternal and paternal sides with multiple family members.  They have noticed for the first couple of years that patient at times has word finding difficulties and she struggles to find words her dose of completion.  She also has been repeating herself a lot and asked the same questions over and over again when asked about appointments of plan for the day she often forgets them and needs to ask constantly.  Family is also noticed that she gets anxious more easily and gets upset more easily at times she is short with her husband.  Executive functions seem fine she is able to manage all activities of daily living by herself and does not need any supervision or help.  She is still able to drive and has never gotten lost or had any near accidents.  She has not had any seizures, loss of consciousness.  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  It is unclear if this was a hypertensive versus hemorrhagic infarct but work-up at that time was negative.  She had residual left homonymous hemianopsia which is improved somewhat but not completely.  She was seen by me in 2018 and 2019 and then lost  to follow-up.  She has had no further clinical episodes suggestive of strokes or TIAs.  She remains on aspirin for stroke prevention and states blood pressures well controlled and today it is 147/84 in office. Update 11/08/2020 : She returns for follow-up after last visit 3 months ago.  She has started taking Cerefolin then she and her husband feel it is helping.  She still continues to have mild short-term memory and intermittent word finding difficulties which are unchanged.  She does participate in solving crossword puzzles every day but does not do other cognitively challenging activities.  She was depressed for the last few months since her mother passed away in 09/30/2022 from advanced Alzheimer's.  She states she is learned to move 1 but she did have to have her sister come and stay with her for a while.  She did undergo MRI scan of the brain on 08/10/2020 which shows changes of hemosiderin and encephalomalacia from old right occipital hemorrhage and mild changes of small vessel disease and generalized atrophy.  No acute abnormalities.  Lab work on 07/30/2020 showed low vitamin B12 of 156 and mild homocystine elevation at 17.8.  RPR and TSH were normal.  Patient was advised to primary physician to begin B12 replacement injections but she has not done that.  EEG was also ordered but for unclear reason has not been done.  She has no new complaints Update 05/22/2021 ; she returns for follow-up after last visit 6 months ago.  She states her  memory and cognitive difficulties are more or less unchanged.  She has some good days and bad days.  When she is under increased stress she notices more memory difficulties.  Her husband has been sick and his knee pain requiring surgery but he developed infected wound on his feet which is being treated.  She admits to having some caregiver burnout.  She has been talking to her therapist which has helped her tremendously.  She remains on Cerefolin for memory loss.  She had EEG  done on 12/06/2020 which was normal.  Repeat vitamin B12 level was normal at 960 and homocystine at 11.8 on 11/08/2020.  She has no new complaints. ROS:   14 system review of systems is positive for memory difficulties, word finding difficulties, peripheral vision loss, imbalance, anxiety, stress, tension headache and all other systems negative  PMH:  Past Medical History:  Diagnosis Date   Chronic kidney disease    stage 3 at last physical   Depression    GERD (gastroesophageal reflux disease)    Hyperlipidemia    Hypertension    PONV (postoperative nausea and vomiting)    No problem with newwer anesthesia   Squamous cell carcinoma of skin 12/13/2019   left malar cheek([id number])   Stroke [facility])     Social History:  Social History   Socioeconomic History   Marital status: Married    Spouse name: Kendra Deleon   Number of children: Not on file   Years of education: Not on file   Highest education level: Not on file  Occupational History   Occupation: retired  Tobacco Use   Smoking status: Never   Smokeless tobacco: Never  Vaping Use   Vaping Use: Never used  Substance and Sexual Activity   Alcohol use: No    Alcohol/week: 0.0 standard drinks   Drug use: No   Sexual activity: Not on file  Other Topics Concern   Not on file  Social History Narrative   Lives with Husband, Kendra Deleon   Right handed   Drinks 1 cup soda occassionally   Social Determinants of Health   Financial Resource Strain: Not on file  Food Insecurity: Not on file  Transportation Needs: Not on file  Physical Activity: Not on file  Stress: Not on file  Social Connections: Not on file  Intimate Partner Violence: Not on file    Medications:   Current Outpatient Medications on File Prior to Visit  Medication Sig Dispense Refill   amLODipine (NORVASC) 10 MG tablet      aspirin 81 MG EC tablet aspirin 81 mg tablet,delayed release  Take 1 tablet every day by oral route.     Calcium Carbonate-Vitamin D3 600-400  MG-UNIT TABS Take 1 tablet by mouth daily.     Cholecalciferol (VITAMIN D3) 2000 UNITS TABS Take 2,000 Units by mouth daily.      fenofibrate 160 MG tablet Take 160 mg by mouth daily.      FLUoxetine (PROZAC) 40 MG capsule 40 mg daily.     guaiFENesin (MUCINEX) 600 MG 12 hr tablet Take by mouth 2 (two) times daily.     ibuprofen (ADVIL,MOTRIN) 200 MG tablet Take 400 mg every 6 (six) hours as needed by mouth for headache or moderate pain.     Influenza Virus Vaccine Split SUSP Afluria 2014-2015 45 mcg (15 mcg x 3)/0.5 mL intramuscular suspension     L-Methylfolate-B12-B6-B2 (CEREFOLIN) 01-30-49-5 MG TABS Take 1 tablet by mouth every morning. 90 tablet 3   pantoprazole (PROTONIX)  40 MG tablet Take 40 mg by mouth daily.      rosuvastatin (CRESTOR) 40 MG tablet Take 40 mg by mouth daily.     No current facility-administered medications on file prior to visit.    Allergies:   Allergies  Allergen Reactions   No Known Allergies     Physical Exam General: well developed, well nourished, seated, in no evident distress Head: head normocephalic and atraumatic.   Neck: supple with no carotid or supraclavicular bruits Cardiovascular: regular rate and rhythm, no murmurs Musculoskeletal: no deformity Skin:  no rash/petichiae Vascular:  Normal pulses all extremities  Neurologic Exam Mental Status: Awake and fully alert. Oriented to place and time. Recent and remote memory diminished. Attention span, concentration and fund of knowledge appropriate. Mood and affect appropriate.  Mini-Mental status not done today as patient refused.  ( on 07/30/20 Exam score 27/30 with deficits in recall and orientation.)  Clock drawing scored 4/4.  Able to name 10 animals which can walk on 4 legs.  Clock drawing 4/4.  Able to copy intersecting pentagons quite well.  Diminished recall 0/3 Cranial Nerves: Fundoscopic exam not done. Pupils equal, briskly reactive to light. Extraocular movements full without nystagmus. Visual  fields show partial left homonymous hemianopsia to confrontation. Hearing intact. Facial sensation intact. Face, tongue, palate moves normally and symmetrically.  Motor: Normal bulk and tone. Normal strength in all tested extremity muscles. Sensory.: intact to touch , pinprick , position and vibratory sensation.  Coordination: Rapid alternating movements normal in all extremities. Finger-to-nose and heel-to-shin performed accurately bilaterally. Gait and Station: Arises from chair without difficulty. Stance is normal. Gait demonstrates normal stride length and balance . Able to heel, toe and tandem walk with moderate difficulty.  Reflexes: 1+ and symmetric. Toes downgoing.       ASSESSMENT: 70 year old Caucasian lady with mild word finding and memory difficulties due to mild cognitive impairment.  She has a very strong family history of Alzheimer's and may be at risk for it.  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  Vascular risk factors of hypertension, hyperlipidemia, prior stroke and age.       PLAN: I had a long discussion the patient with regards to her memory difficulties and mild cognitive impairment and risk for developing Alzheimer's and answered questions.  I recommend she continue taking Cerefolin NAC daily for her memory loss .  I encouraged her to increase participation in cognitively challenging activities like solving crossword puzzles, playing bridge and sodoku.  We also discussed memory compensation strategies.  She will return for follow-up in the future in 6 months or call earlier if necessary. Greater than 50% time during this 25-minute consultation visit was spent on counseling and coordination of care about her memory loss and cognitive impairment and risk for Alzheimer's discussion. Antony Contras, MD Note: This document was prepared with digital dictation and possible  smart phrase technology. Any transcriptional errors that result from this process are unintentional.

## 2021-05-22 NOTE — Patient Instructions (Signed)
I had a long discussion the patient with regards to her memory difficulties and mild cognitive impairment and risk for developing Alzheimer's and answered questions.  I recommend she continue taking Cerefolin NAC daily for her memory loss .  I encouraged her to increase participation in cognitively challenging activities like solving crossword puzzles, playing bridge and sodoku.  We also discussed memory compensation strategies.  She will return for follow-up in the future in 6 months or call earlier if necessary. Memory Compensation Strategies  Use "WARM" strategy.  W= write it down  A= associate it  R= repeat it  M= make a mental note  2.   You can keep a Social worker.  Use a 3-ring notebook with sections for the following: calendar, important names and phone numbers,  medications, doctors' names/phone numbers, lists/reminders, and a section to journal what you did  each day.   3.    Use a calendar to write appointments down.  4.    Write yourself a schedule for the day.  This can be placed on the calendar or in a separate section of the Memory Notebook.  Keeping a  regular schedule can help memory.  5.    Use medication organizer with sections for each day or morning/evening pills.  You may need help loading it  6.    Keep a basket, or pegboard by the door.  Place items that you need to take out with you in the basket or on the pegboard.  You may also want to  include a message board for reminders.  7.    Use sticky notes.  Place sticky notes with reminders in a place where the task is performed.  For example: " turn off the  stove" placed by the stove, "lock the door" placed on the door at eye level, " take your medications" on  the bathroom mirror or by the place where you normally take your medications.  8.    Use alarms/timers.  Use while cooking to remind yourself to check on food or as a reminder to take your medicine, or as a  reminder to make a call, or as a reminder to  perform another task, etc.

## 2021-06-03 DIAGNOSIS — F4312 Post-traumatic stress disorder, chronic: Secondary | ICD-10-CM | POA: Diagnosis not present

## 2021-06-17 ENCOUNTER — Other Ambulatory Visit: Payer: Self-pay | Admitting: Neurology

## 2021-06-19 DIAGNOSIS — F4312 Post-traumatic stress disorder, chronic: Secondary | ICD-10-CM | POA: Diagnosis not present

## 2021-06-26 DIAGNOSIS — I619 Nontraumatic intracerebral hemorrhage, unspecified: Secondary | ICD-10-CM | POA: Diagnosis not present

## 2021-06-26 DIAGNOSIS — N183 Chronic kidney disease, stage 3 unspecified: Secondary | ICD-10-CM | POA: Diagnosis not present

## 2021-06-26 DIAGNOSIS — E7800 Pure hypercholesterolemia, unspecified: Secondary | ICD-10-CM | POA: Diagnosis not present

## 2021-06-26 DIAGNOSIS — I639 Cerebral infarction, unspecified: Secondary | ICD-10-CM | POA: Diagnosis not present

## 2021-06-26 DIAGNOSIS — K219 Gastro-esophageal reflux disease without esophagitis: Secondary | ICD-10-CM | POA: Diagnosis not present

## 2021-06-26 DIAGNOSIS — I129 Hypertensive chronic kidney disease with stage 1 through stage 4 chronic kidney disease, or unspecified chronic kidney disease: Secondary | ICD-10-CM | POA: Diagnosis not present

## 2021-07-02 DIAGNOSIS — Z23 Encounter for immunization: Secondary | ICD-10-CM | POA: Diagnosis not present

## 2021-07-02 DIAGNOSIS — F4312 Post-traumatic stress disorder, chronic: Secondary | ICD-10-CM | POA: Diagnosis not present

## 2021-07-15 DIAGNOSIS — F4312 Post-traumatic stress disorder, chronic: Secondary | ICD-10-CM | POA: Diagnosis not present

## 2021-08-13 DIAGNOSIS — F4312 Post-traumatic stress disorder, chronic: Secondary | ICD-10-CM | POA: Diagnosis not present

## 2021-09-02 DIAGNOSIS — F4312 Post-traumatic stress disorder, chronic: Secondary | ICD-10-CM | POA: Diagnosis not present

## 2021-09-23 DIAGNOSIS — Z1231 Encounter for screening mammogram for malignant neoplasm of breast: Secondary | ICD-10-CM | POA: Diagnosis not present

## 2021-10-21 DIAGNOSIS — Z8673 Personal history of transient ischemic attack (TIA), and cerebral infarction without residual deficits: Secondary | ICD-10-CM | POA: Diagnosis not present

## 2021-10-21 DIAGNOSIS — F411 Generalized anxiety disorder: Secondary | ICD-10-CM | POA: Diagnosis not present

## 2021-10-21 DIAGNOSIS — I129 Hypertensive chronic kidney disease with stage 1 through stage 4 chronic kidney disease, or unspecified chronic kidney disease: Secondary | ICD-10-CM | POA: Diagnosis not present

## 2021-10-21 DIAGNOSIS — E559 Vitamin D deficiency, unspecified: Secondary | ICD-10-CM | POA: Diagnosis not present

## 2021-10-21 DIAGNOSIS — N183 Chronic kidney disease, stage 3 unspecified: Secondary | ICD-10-CM | POA: Diagnosis not present

## 2021-10-21 DIAGNOSIS — K219 Gastro-esophageal reflux disease without esophagitis: Secondary | ICD-10-CM | POA: Diagnosis not present

## 2021-10-21 DIAGNOSIS — E7800 Pure hypercholesterolemia, unspecified: Secondary | ICD-10-CM | POA: Diagnosis not present

## 2021-10-28 DIAGNOSIS — F4312 Post-traumatic stress disorder, chronic: Secondary | ICD-10-CM | POA: Diagnosis not present

## 2021-11-11 DIAGNOSIS — F4312 Post-traumatic stress disorder, chronic: Secondary | ICD-10-CM | POA: Diagnosis not present

## 2021-11-20 ENCOUNTER — Ambulatory Visit: Payer: Medicare Other | Admitting: Neurology

## 2021-12-17 DIAGNOSIS — F4312 Post-traumatic stress disorder, chronic: Secondary | ICD-10-CM | POA: Diagnosis not present

## 2021-12-24 DIAGNOSIS — M810 Age-related osteoporosis without current pathological fracture: Secondary | ICD-10-CM | POA: Diagnosis not present

## 2021-12-24 DIAGNOSIS — Z780 Asymptomatic menopausal state: Secondary | ICD-10-CM | POA: Diagnosis not present

## 2021-12-24 DIAGNOSIS — M8589 Other specified disorders of bone density and structure, multiple sites: Secondary | ICD-10-CM | POA: Diagnosis not present

## 2021-12-30 ENCOUNTER — Telehealth: Payer: Self-pay | Admitting: Neurology

## 2021-12-30 ENCOUNTER — Ambulatory Visit: Payer: Medicare Other | Admitting: Neurology

## 2021-12-30 NOTE — Telephone Encounter (Signed)
Pt cancelled appt due to family emergency. ?

## 2022-01-24 DIAGNOSIS — I639 Cerebral infarction, unspecified: Secondary | ICD-10-CM | POA: Diagnosis not present

## 2022-01-24 DIAGNOSIS — I129 Hypertensive chronic kidney disease with stage 1 through stage 4 chronic kidney disease, or unspecified chronic kidney disease: Secondary | ICD-10-CM | POA: Diagnosis not present

## 2022-01-24 DIAGNOSIS — N183 Chronic kidney disease, stage 3 unspecified: Secondary | ICD-10-CM | POA: Diagnosis not present

## 2022-01-24 DIAGNOSIS — E7800 Pure hypercholesterolemia, unspecified: Secondary | ICD-10-CM | POA: Diagnosis not present

## 2022-01-24 DIAGNOSIS — K219 Gastro-esophageal reflux disease without esophagitis: Secondary | ICD-10-CM | POA: Diagnosis not present

## 2022-03-06 ENCOUNTER — Telehealth: Payer: Self-pay | Admitting: Neurology

## 2022-03-06 NOTE — Telephone Encounter (Signed)
Rescheduled 8/7 appointment with pt over the phone - MD out

## 2022-04-07 ENCOUNTER — Ambulatory Visit: Payer: Medicare Other | Admitting: Neurology

## 2022-04-22 ENCOUNTER — Ambulatory Visit: Payer: Medicare Other | Admitting: Neurology

## 2022-04-22 ENCOUNTER — Telehealth: Payer: Self-pay | Admitting: Neurology

## 2022-04-22 NOTE — Telephone Encounter (Signed)
Pt cancelled appt due to having a stomach bug. 

## 2022-04-23 DIAGNOSIS — I10 Essential (primary) hypertension: Secondary | ICD-10-CM | POA: Diagnosis not present

## 2022-04-23 DIAGNOSIS — E559 Vitamin D deficiency, unspecified: Secondary | ICD-10-CM | POA: Diagnosis not present

## 2022-04-23 DIAGNOSIS — E7800 Pure hypercholesterolemia, unspecified: Secondary | ICD-10-CM | POA: Diagnosis not present

## 2022-04-23 DIAGNOSIS — Z8673 Personal history of transient ischemic attack (TIA), and cerebral infarction without residual deficits: Secondary | ICD-10-CM | POA: Diagnosis not present

## 2022-04-23 DIAGNOSIS — K219 Gastro-esophageal reflux disease without esophagitis: Secondary | ICD-10-CM | POA: Diagnosis not present

## 2022-04-23 DIAGNOSIS — F411 Generalized anxiety disorder: Secondary | ICD-10-CM | POA: Diagnosis not present

## 2022-04-23 DIAGNOSIS — I129 Hypertensive chronic kidney disease with stage 1 through stage 4 chronic kidney disease, or unspecified chronic kidney disease: Secondary | ICD-10-CM | POA: Diagnosis not present

## 2022-04-23 DIAGNOSIS — Z0000 Encounter for general adult medical examination without abnormal findings: Secondary | ICD-10-CM | POA: Diagnosis not present

## 2022-05-14 ENCOUNTER — Other Ambulatory Visit: Payer: Self-pay | Admitting: Neurology

## 2022-07-15 DIAGNOSIS — H3500 Unspecified background retinopathy: Secondary | ICD-10-CM | POA: Diagnosis not present

## 2022-07-15 DIAGNOSIS — H25813 Combined forms of age-related cataract, bilateral: Secondary | ICD-10-CM | POA: Diagnosis not present

## 2022-07-15 DIAGNOSIS — H5203 Hypermetropia, bilateral: Secondary | ICD-10-CM | POA: Diagnosis not present

## 2022-07-21 DIAGNOSIS — Z23 Encounter for immunization: Secondary | ICD-10-CM | POA: Diagnosis not present

## 2022-08-07 ENCOUNTER — Other Ambulatory Visit: Payer: Self-pay | Admitting: Neurology

## 2022-08-12 DIAGNOSIS — H25813 Combined forms of age-related cataract, bilateral: Secondary | ICD-10-CM | POA: Diagnosis not present

## 2022-08-28 ENCOUNTER — Ambulatory Visit: Payer: Medicare Other | Admitting: Neurology

## 2022-09-16 ENCOUNTER — Telehealth: Payer: Self-pay | Admitting: Neurology

## 2022-09-22 DIAGNOSIS — E559 Vitamin D deficiency, unspecified: Secondary | ICD-10-CM | POA: Diagnosis not present

## 2022-09-22 DIAGNOSIS — R413 Other amnesia: Secondary | ICD-10-CM | POA: Diagnosis not present

## 2022-09-22 DIAGNOSIS — Z0000 Encounter for general adult medical examination without abnormal findings: Secondary | ICD-10-CM | POA: Diagnosis not present

## 2022-09-22 DIAGNOSIS — Z8673 Personal history of transient ischemic attack (TIA), and cerebral infarction without residual deficits: Secondary | ICD-10-CM | POA: Diagnosis not present

## 2022-09-22 DIAGNOSIS — Z820 Family history of epilepsy and other diseases of the nervous system: Secondary | ICD-10-CM | POA: Diagnosis not present

## 2022-09-22 DIAGNOSIS — I5189 Other ill-defined heart diseases: Secondary | ICD-10-CM | POA: Diagnosis not present

## 2022-09-22 DIAGNOSIS — R7309 Other abnormal glucose: Secondary | ICD-10-CM | POA: Diagnosis not present

## 2022-09-22 DIAGNOSIS — E785 Hyperlipidemia, unspecified: Secondary | ICD-10-CM | POA: Diagnosis not present

## 2022-09-22 DIAGNOSIS — Z1231 Encounter for screening mammogram for malignant neoplasm of breast: Secondary | ICD-10-CM | POA: Diagnosis not present

## 2022-09-22 DIAGNOSIS — Z780 Asymptomatic menopausal state: Secondary | ICD-10-CM | POA: Diagnosis not present

## 2022-09-22 DIAGNOSIS — I6523 Occlusion and stenosis of bilateral carotid arteries: Secondary | ICD-10-CM | POA: Diagnosis not present

## 2022-09-22 DIAGNOSIS — R4189 Other symptoms and signs involving cognitive functions and awareness: Secondary | ICD-10-CM | POA: Diagnosis not present

## 2022-09-22 DIAGNOSIS — F99 Mental disorder, not otherwise specified: Secondary | ICD-10-CM | POA: Diagnosis not present

## 2022-09-22 DIAGNOSIS — I10 Essential (primary) hypertension: Secondary | ICD-10-CM | POA: Diagnosis not present

## 2022-10-07 DIAGNOSIS — R4182 Altered mental status, unspecified: Secondary | ICD-10-CM | POA: Diagnosis not present

## 2022-10-07 DIAGNOSIS — R4189 Other symptoms and signs involving cognitive functions and awareness: Secondary | ICD-10-CM | POA: Diagnosis not present

## 2022-10-07 DIAGNOSIS — G3184 Mild cognitive impairment, so stated: Secondary | ICD-10-CM | POA: Diagnosis not present

## 2022-10-07 DIAGNOSIS — Z0389 Encounter for observation for other suspected diseases and conditions ruled out: Secondary | ICD-10-CM | POA: Diagnosis not present

## 2022-10-07 DIAGNOSIS — Z8673 Personal history of transient ischemic attack (TIA), and cerebral infarction without residual deficits: Secondary | ICD-10-CM | POA: Diagnosis not present

## 2022-10-07 DIAGNOSIS — R413 Other amnesia: Secondary | ICD-10-CM | POA: Diagnosis not present

## 2022-10-07 DIAGNOSIS — I6782 Cerebral ischemia: Secondary | ICD-10-CM | POA: Diagnosis not present

## 2022-10-07 DIAGNOSIS — I6523 Occlusion and stenosis of bilateral carotid arteries: Secondary | ICD-10-CM | POA: Diagnosis not present

## 2022-10-07 DIAGNOSIS — Z820 Family history of epilepsy and other diseases of the nervous system: Secondary | ICD-10-CM | POA: Diagnosis not present

## 2022-10-13 DIAGNOSIS — I10 Essential (primary) hypertension: Secondary | ICD-10-CM | POA: Diagnosis not present

## 2022-10-13 DIAGNOSIS — F99 Mental disorder, not otherwise specified: Secondary | ICD-10-CM | POA: Diagnosis not present

## 2022-10-13 DIAGNOSIS — R413 Other amnesia: Secondary | ICD-10-CM | POA: Diagnosis not present

## 2022-10-13 DIAGNOSIS — I680 Cerebral amyloid angiopathy: Secondary | ICD-10-CM | POA: Diagnosis not present

## 2022-10-13 DIAGNOSIS — R4189 Other symptoms and signs involving cognitive functions and awareness: Secondary | ICD-10-CM | POA: Diagnosis not present

## 2022-10-13 DIAGNOSIS — E854 Organ-limited amyloidosis: Secondary | ICD-10-CM | POA: Diagnosis not present

## 2022-10-13 DIAGNOSIS — Z8673 Personal history of transient ischemic attack (TIA), and cerebral infarction without residual deficits: Secondary | ICD-10-CM | POA: Diagnosis not present

## 2022-10-13 DIAGNOSIS — E785 Hyperlipidemia, unspecified: Secondary | ICD-10-CM | POA: Diagnosis not present

## 2022-10-30 ENCOUNTER — Other Ambulatory Visit: Payer: Self-pay | Admitting: Neurology

## 2022-11-03 ENCOUNTER — Other Ambulatory Visit: Payer: Self-pay | Admitting: Neurology

## 2022-11-13 ENCOUNTER — Telehealth: Payer: Self-pay | Admitting: Neurology

## 2022-11-13 NOTE — Telephone Encounter (Signed)
YES

## 2022-11-13 NOTE — Telephone Encounter (Signed)
Beverlee Nims called from [facility]. Stated the max that can be filled for L-Methylfolate-B12-B6-B2 (CEREFOLIN) 01-30-49-5 MG TABS  is a 90 day supply

## 2022-11-13 NOTE — Telephone Encounter (Signed)
This patient has not been seen since 2022 and therefore we cannot refill medications.

## 2022-11-26 ENCOUNTER — Telehealth: Payer: Self-pay | Admitting: Neurology

## 2022-11-26 NOTE — Telephone Encounter (Signed)
Returned call to pharmacy and stated that the medication is unable to be refilled by one of our providers as she has not been seen since 2022 and would need to make an appt to receive refills the pharmacist voiced gratitude and understanding of all discussed.

## 2022-11-26 NOTE — Telephone Encounter (Signed)
Diane from [facility] called wanting to know if a new Rx can be sent, due to the quantity being lower than what they dispense. The need the Rx to state that it is to be dispense as a 90 day supply. Please advise.
